# Patient Record
Sex: Male | Born: 1985 | Race: Black or African American | Hispanic: No | Marital: Single | Smoking: Former smoker
Health system: Southern US, Community
[De-identification: ages and names within clinical notes are randomized; demographics above are authoritative.]

---
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
## 2004-01-27 ENCOUNTER — Emergency Department (HOSPITAL_COMMUNITY): Admission: EM | Admit: 2004-01-27 | Discharge: 2004-01-29 | Payer: Self-pay | Admitting: Emergency Medicine

## 2005-11-22 ENCOUNTER — Emergency Department (HOSPITAL_COMMUNITY): Admission: EM | Admit: 2005-11-22 | Discharge: 2005-11-22 | Payer: Self-pay | Admitting: Emergency Medicine

## 2006-11-23 ENCOUNTER — Emergency Department (HOSPITAL_COMMUNITY): Admission: EM | Admit: 2006-11-23 | Discharge: 2006-11-23 | Payer: Self-pay | Admitting: Emergency Medicine

## 2007-02-02 ENCOUNTER — Emergency Department (HOSPITAL_COMMUNITY): Admission: EM | Admit: 2007-02-02 | Discharge: 2007-02-02 | Payer: Self-pay | Admitting: Emergency Medicine

## 2008-05-12 ENCOUNTER — Emergency Department (HOSPITAL_COMMUNITY): Admission: EM | Admit: 2008-05-12 | Discharge: 2008-05-13 | Payer: Self-pay | Admitting: Emergency Medicine

## 2008-07-28 ENCOUNTER — Emergency Department (HOSPITAL_COMMUNITY): Admission: EM | Admit: 2008-07-28 | Discharge: 2008-07-28 | Payer: Self-pay | Admitting: Emergency Medicine

## 2010-08-15 ENCOUNTER — Emergency Department (HOSPITAL_COMMUNITY)
Admission: EM | Admit: 2010-08-15 | Discharge: 2010-08-15 | Disposition: A | Discharge status: Home / Self Care | Payer: No Typology Code available for payment source | Attending: Emergency Medicine | Admitting: Emergency Medicine

## 2010-08-15 DIAGNOSIS — R05 Cough: Secondary | ICD-10-CM | POA: Insufficient documentation

## 2010-08-15 DIAGNOSIS — J069 Acute upper respiratory infection, unspecified: Secondary | ICD-10-CM | POA: Insufficient documentation

## 2010-08-15 DIAGNOSIS — R059 Cough, unspecified: Secondary | ICD-10-CM | POA: Insufficient documentation

## 2010-08-15 DIAGNOSIS — M25569 Pain in unspecified knee: Secondary | ICD-10-CM | POA: Insufficient documentation

## 2010-08-15 DIAGNOSIS — R079 Chest pain, unspecified: Secondary | ICD-10-CM | POA: Insufficient documentation

## 2010-08-15 DIAGNOSIS — J3489 Other specified disorders of nose and nasal sinuses: Secondary | ICD-10-CM | POA: Insufficient documentation

## 2010-08-15 DIAGNOSIS — R070 Pain in throat: Secondary | ICD-10-CM | POA: Insufficient documentation

## 2010-11-15 ENCOUNTER — Emergency Department (HOSPITAL_COMMUNITY)
Admission: EM | Admit: 2010-11-15 | Discharge: 2010-11-15 | Disposition: A | Discharge status: Home / Self Care | Payer: Self-pay | Attending: Emergency Medicine | Admitting: Emergency Medicine

## 2010-11-15 DIAGNOSIS — W010XXA Fall on same level from slipping, tripping and stumbling without subsequent striking against object, initial encounter: Secondary | ICD-10-CM | POA: Insufficient documentation

## 2010-11-15 DIAGNOSIS — Y9302 Activity, running: Secondary | ICD-10-CM | POA: Insufficient documentation

## 2010-11-15 DIAGNOSIS — S0003XA Contusion of scalp, initial encounter: Secondary | ICD-10-CM | POA: Insufficient documentation

## 2011-02-10 ENCOUNTER — Emergency Department (HOSPITAL_COMMUNITY)
Admission: EM | Admit: 2011-02-10 | Discharge: 2011-02-10 | Disposition: A | Discharge status: Home / Self Care | Payer: Self-pay | Attending: Emergency Medicine | Admitting: Emergency Medicine

## 2011-02-10 DIAGNOSIS — R229 Localized swelling, mass and lump, unspecified: Secondary | ICD-10-CM | POA: Insufficient documentation

## 2011-02-10 DIAGNOSIS — T6391XA Toxic effect of contact with unspecified venomous animal, accidental (unintentional), initial encounter: Secondary | ICD-10-CM | POA: Insufficient documentation

## 2011-02-10 DIAGNOSIS — L259 Unspecified contact dermatitis, unspecified cause: Secondary | ICD-10-CM | POA: Insufficient documentation

## 2011-02-10 DIAGNOSIS — L298 Other pruritus: Secondary | ICD-10-CM | POA: Insufficient documentation

## 2011-02-10 DIAGNOSIS — L2989 Other pruritus: Secondary | ICD-10-CM | POA: Insufficient documentation

## 2011-02-10 DIAGNOSIS — T63461A Toxic effect of venom of wasps, accidental (unintentional), initial encounter: Secondary | ICD-10-CM | POA: Insufficient documentation

## 2011-03-23 LAB — I-STAT 8, (EC8 V) (CONVERTED LAB)
Acid-base deficit: 1
BUN: 12
Bicarbonate: 24.6 — ABNORMAL HIGH
Chloride: 106
Glucose, Bld: 94
HCT: 47
Hemoglobin: 16
Operator id: 151321
Potassium: 3.8
Sodium: 140
TCO2: 26
pCO2, Ven: 43.4 — ABNORMAL LOW
pH, Ven: 7.361 — ABNORMAL HIGH

## 2011-03-23 LAB — POCT I-STAT CREATININE
Creatinine, Ser: 1.1
Operator id: 151321

## 2011-03-23 LAB — URINALYSIS, ROUTINE W REFLEX MICROSCOPIC
Bilirubin Urine: NEGATIVE
Glucose, UA: NEGATIVE
Hgb urine dipstick: NEGATIVE
Ketones, ur: 15 — AB
Nitrite: NEGATIVE
Protein, ur: NEGATIVE
Specific Gravity, Urine: 1.031 — ABNORMAL HIGH
Urobilinogen, UA: 1
pH: 7.5

## 2011-03-29 LAB — GC/CHLAMYDIA PROBE AMP, GENITAL
Chlamydia, DNA Probe: NEGATIVE
GC Probe Amp, Genital: NEGATIVE

## 2012-08-30 ENCOUNTER — Inpatient Hospital Stay (HOSPITAL_COMMUNITY): Payer: Self-pay

## 2012-08-30 ENCOUNTER — Emergency Department (HOSPITAL_COMMUNITY): Payer: Self-pay

## 2012-08-30 ENCOUNTER — Encounter (HOSPITAL_COMMUNITY): Payer: Self-pay | Admitting: Family Medicine

## 2012-08-30 ENCOUNTER — Inpatient Hospital Stay (HOSPITAL_COMMUNITY)
Admission: EM | Admit: 2012-08-30 | Discharge: 2012-09-03 | DRG: 131 | Payer: MEDICAID | Attending: General Surgery | Admitting: General Surgery

## 2012-08-30 ENCOUNTER — Encounter (HOSPITAL_COMMUNITY): Payer: Self-pay | Admitting: Anesthesiology

## 2012-08-30 ENCOUNTER — Encounter (HOSPITAL_COMMUNITY): Admission: EM | Payer: Self-pay | Source: Home / Self Care

## 2012-08-30 ENCOUNTER — Inpatient Hospital Stay (HOSPITAL_COMMUNITY): Payer: Self-pay | Admitting: Anesthesiology

## 2012-08-30 DIAGNOSIS — S72009A Fracture of unspecified part of neck of unspecified femur, initial encounter for closed fracture: Secondary | ICD-10-CM | POA: Diagnosis present

## 2012-08-30 DIAGNOSIS — S2241XA Multiple fractures of ribs, right side, initial encounter for closed fracture: Secondary | ICD-10-CM

## 2012-08-30 DIAGNOSIS — S2249XA Multiple fractures of ribs, unspecified side, initial encounter for closed fracture: Secondary | ICD-10-CM | POA: Diagnosis present

## 2012-08-30 DIAGNOSIS — S1093XA Contusion of unspecified part of neck, initial encounter: Secondary | ICD-10-CM | POA: Diagnosis present

## 2012-08-30 DIAGNOSIS — S02609A Fracture of mandible, unspecified, initial encounter for closed fracture: Secondary | ICD-10-CM

## 2012-08-30 DIAGNOSIS — M549 Dorsalgia, unspecified: Secondary | ICD-10-CM

## 2012-08-30 DIAGNOSIS — F17200 Nicotine dependence, unspecified, uncomplicated: Secondary | ICD-10-CM | POA: Diagnosis present

## 2012-08-30 DIAGNOSIS — D62 Acute posthemorrhagic anemia: Secondary | ICD-10-CM | POA: Diagnosis not present

## 2012-08-30 DIAGNOSIS — Y92410 Unspecified street and highway as the place of occurrence of the external cause: Secondary | ICD-10-CM

## 2012-08-30 DIAGNOSIS — S7291XA Unspecified fracture of right femur, initial encounter for closed fracture: Secondary | ICD-10-CM

## 2012-08-30 DIAGNOSIS — Y998 Other external cause status: Secondary | ICD-10-CM

## 2012-08-30 DIAGNOSIS — S0266XA Fracture of symphysis of mandible, initial encounter for closed fracture: Principal | ICD-10-CM | POA: Diagnosis present

## 2012-08-30 DIAGNOSIS — S0093XA Contusion of unspecified part of head, initial encounter: Secondary | ICD-10-CM

## 2012-08-30 DIAGNOSIS — J90 Pleural effusion, not elsewhere classified: Secondary | ICD-10-CM

## 2012-08-30 DIAGNOSIS — S2239XA Fracture of one rib, unspecified side, initial encounter for closed fracture: Secondary | ICD-10-CM

## 2012-08-30 DIAGNOSIS — S0003XA Contusion of scalp, initial encounter: Secondary | ICD-10-CM | POA: Diagnosis present

## 2012-08-30 DIAGNOSIS — S72001A Fracture of unspecified part of neck of right femur, initial encounter for closed fracture: Secondary | ICD-10-CM

## 2012-08-30 DIAGNOSIS — Z23 Encounter for immunization: Secondary | ICD-10-CM

## 2012-08-30 HISTORY — PX: ORIF MANDIBULAR FRACTURE: SHX2127

## 2012-08-30 HISTORY — PX: HIP PINNING,CANNULATED: SHX1758

## 2012-08-30 LAB — SAMPLE TO BLOOD BANK

## 2012-08-30 LAB — URINE MICROSCOPIC-ADD ON

## 2012-08-30 LAB — COMPREHENSIVE METABOLIC PANEL
ALT: 50 U/L (ref 0–53)
AST: 50 U/L — ABNORMAL HIGH (ref 0–37)
Albumin: 4 g/dL (ref 3.5–5.2)
Alkaline Phosphatase: 81 U/L (ref 39–117)
BUN: 18 mg/dL (ref 6–23)
CO2: 21 mEq/L (ref 19–32)
Calcium: 9.1 mg/dL (ref 8.4–10.5)
Chloride: 106 mEq/L (ref 96–112)
Creatinine, Ser: 1.1 mg/dL (ref 0.50–1.35)
GFR calc Af Amer: >90 mL/min (ref >90)
GFR calc non Af Amer: >90 mL/min (ref >90)
Glucose, Bld: 123 mg/dL — ABNORMAL HIGH (ref 70–99)
Potassium: 3.4 mEq/L — ABNORMAL LOW (ref 3.5–5.1)
Sodium: 142 mEq/L (ref 135–145)
Total Bilirubin: 0.6 mg/dL (ref 0.3–1.2)
Total Protein: 7.2 g/dL (ref 6.0–8.3)

## 2012-08-30 LAB — CBC
HCT: 46.4 % (ref 39.0–52.0)
Hemoglobin: 16.4 g/dL (ref 13.0–17.0)
MCH: 31.7 pg (ref 26.0–34.0)
MCHC: 35.3 g/dL (ref 30.0–36.0)
MCV: 89.7 fL (ref 78.0–100.0)
Platelets: 201 10*3/uL (ref 150–400)
RBC: 5.17 MIL/uL (ref 4.22–5.81)
RDW: 13.6 % (ref 11.5–15.5)
WBC: 17.9 10*3/uL — ABNORMAL HIGH (ref 4.0–10.5)

## 2012-08-30 LAB — APTT: aPTT: 28 seconds (ref 24–37)

## 2012-08-30 LAB — POCT I-STAT, CHEM 8
BUN: 19 mg/dL (ref 6–23)
Calcium, Ion: 1.07 mmol/L — ABNORMAL LOW (ref 1.12–1.23)
Chloride: 109 meq/L (ref 96–112)
Creatinine, Ser: 1.1 mg/dL (ref 0.50–1.35)
Glucose, Bld: 123 mg/dL — ABNORMAL HIGH (ref 70–99)
HCT: 50 % (ref 39.0–52.0)
Hemoglobin: 17 g/dL (ref 13.0–17.0)
Potassium: 3.4 mEq/L — ABNORMAL LOW (ref 3.5–5.1)
Sodium: 141 meq/L (ref 135–145)
TCO2: 22 mmol/L (ref 0–100)

## 2012-08-30 LAB — RAPID URINE DRUG SCREEN, HOSP PERFORMED
Amphetamines: NOT DETECTED
Barbiturates: NOT DETECTED
Benzodiazepines: NOT DETECTED
Cocaine: NOT DETECTED
Opiates: NOT DETECTED
Tetrahydrocannabinol: POSITIVE — AB

## 2012-08-30 LAB — ETHANOL: Alcohol, Ethyl (B): <11 mg/dL (ref 0–11)

## 2012-08-30 LAB — PROTIME-INR
INR: 1.01 (ref 0.00–1.49)
Prothrombin Time: 13.2 seconds (ref 11.6–15.2)

## 2012-08-30 LAB — URINALYSIS, ROUTINE W REFLEX MICROSCOPIC
Bilirubin Urine: NEGATIVE
Glucose, UA: NEGATIVE mg/dL
Ketones, ur: NEGATIVE mg/dL
Leukocytes, UA: NEGATIVE
Nitrite: NEGATIVE
Protein, ur: 30 mg/dL — AB
Specific Gravity, Urine: 1.02 (ref 1.005–1.030)
Urobilinogen, UA: 0.2 mg/dL (ref 0.0–1.0)
pH: 6 (ref 5.0–8.0)

## 2012-08-30 SURGERY — FIXATION, FEMUR, NECK, PERCUTANEOUS, USING SCREW
Anesthesia: General | Site: Mouth | Laterality: Right | Wound class: Clean

## 2012-08-30 MED ORDER — METOCLOPRAMIDE HCL 5 MG/ML IJ SOLN
INTRAMUSCULAR | Status: DC | PRN
  Administered 2012-08-30: 10 mg via INTRAVENOUS

## 2012-08-30 MED ORDER — ARTIFICIAL TEARS OP OINT
TOPICAL_OINTMENT | OPHTHALMIC | Status: DC | PRN
  Administered 2012-08-30: 1 via OPHTHALMIC

## 2012-08-30 MED ORDER — 0.9 % SODIUM CHLORIDE (POUR BTL) OPTIME
TOPICAL | Status: DC | PRN
  Administered 2012-08-30: 1000 mL

## 2012-08-30 MED ORDER — PROPOFOL 10 MG/ML IV BOLUS
INTRAVENOUS | Status: DC | PRN
  Administered 2012-08-30: 200 mg via INTRAVENOUS

## 2012-08-30 MED ORDER — FENTANYL CITRATE 0.05 MG/ML IJ SOLN
50.0000 ug | INTRAMUSCULAR | Status: DC | PRN
  Administered 2012-08-30 – 2012-09-01 (×2): 50 ug via INTRAVENOUS
  Filled 2012-08-30 (×2): qty 2

## 2012-08-30 MED ORDER — DEXTROSE 5 % IV SOLN
INTRAVENOUS | Status: DC | PRN
  Administered 2012-08-30: 21:00:00 via INTRAVENOUS

## 2012-08-30 MED ORDER — SUCCINYLCHOLINE CHLORIDE 20 MG/ML IJ SOLN
INTRAMUSCULAR | Status: DC | PRN
  Administered 2012-08-30: 140 mg via INTRAVENOUS

## 2012-08-30 MED ORDER — FENTANYL CITRATE 0.05 MG/ML IJ SOLN
INTRAMUSCULAR | Status: DC | PRN
  Administered 2012-08-30: 50 ug via INTRAVENOUS
  Administered 2012-08-30: 200 ug via INTRAVENOUS
  Administered 2012-08-30 (×2): 50 ug via INTRAVENOUS
  Administered 2012-08-30: 150 ug via INTRAVENOUS
  Administered 2012-08-30: 50 ug via INTRAVENOUS
  Administered 2012-08-31: 100 ug via INTRAVENOUS
  Administered 2012-08-31 (×2): 50 ug via INTRAVENOUS

## 2012-08-30 MED ORDER — LIDOCAINE-EPINEPHRINE 2 %-1:100000 IJ SOLN
INTRAMUSCULAR | Status: AC
  Filled 2012-08-30: qty 3.4

## 2012-08-30 MED ORDER — IOHEXOL 300 MG/ML  SOLN
100.0000 mL | Freq: Once | INTRAMUSCULAR | Status: AC | PRN
  Administered 2012-08-30: 100 mL via INTRAVENOUS

## 2012-08-30 MED ORDER — MIDAZOLAM HCL 5 MG/5ML IJ SOLN
INTRAMUSCULAR | Status: DC | PRN
  Administered 2012-08-30: 2 mg via INTRAVENOUS

## 2012-08-30 MED ORDER — ONDANSETRON HCL 4 MG/2ML IJ SOLN
INTRAMUSCULAR | Status: DC | PRN
  Administered 2012-08-30 – 2012-08-31 (×2): 4 mg via INTRAVENOUS

## 2012-08-30 MED ORDER — FENTANYL CITRATE 0.05 MG/ML IJ SOLN
50.0000 ug | Freq: Once | INTRAMUSCULAR | Status: AC
  Administered 2012-08-30: 50 ug via INTRAVENOUS
  Filled 2012-08-30: qty 2

## 2012-08-30 MED ORDER — DEXAMETHASONE SODIUM PHOSPHATE 4 MG/ML IJ SOLN
INTRAMUSCULAR | Status: DC | PRN
  Administered 2012-08-30: 12 mg via INTRAVENOUS

## 2012-08-30 MED ORDER — LACTATED RINGERS IV SOLN
INTRAVENOUS | Status: DC | PRN
  Administered 2012-08-30 – 2012-08-31 (×5): via INTRAVENOUS

## 2012-08-30 MED ORDER — VECURONIUM BROMIDE 10 MG IV SOLR
INTRAVENOUS | Status: DC | PRN
  Administered 2012-08-30: 10 mg via INTRAVENOUS
  Administered 2012-08-30: 5 mg via INTRAVENOUS

## 2012-08-30 MED ORDER — CEFAZOLIN SODIUM-DEXTROSE 2-3 GM-% IV SOLR
2.0000 g | Freq: Once | INTRAVENOUS | Status: AC
  Administered 2012-08-30: 2 g via INTRAVENOUS
  Administered 2012-08-31: 1 g via INTRAVENOUS

## 2012-08-30 MED ORDER — LIDOCAINE-EPINEPHRINE 2 %-1:100000 IJ SOLN
INTRAMUSCULAR | Status: AC
  Filled 2012-08-30: qty 1

## 2012-08-30 MED ORDER — LIDOCAINE HCL (CARDIAC) 20 MG/ML IV SOLN
INTRAVENOUS | Status: DC | PRN
  Administered 2012-08-30: 60 mg via INTRAVENOUS

## 2012-08-30 SURGICAL SUPPLY — 101 items
1.6 DRILL BIT ×1 IMPLANT
ADH SKN CLS APL DERMABOND .7 (GAUZE/BANDAGES/DRESSINGS) ×4
ATTRACTOMAT 16X20 MAGNETIC DRP (DRAPES) ×2 IMPLANT
BAND DENTAL 1/4IN PULL MED (MISCELLANEOUS) IMPLANT
BAND DENTAL 3/16IN PULL HEAVY (MISCELLANEOUS) ×1 IMPLANT
BIT DRILL 5 ACE CANN QC (BIT) ×1 IMPLANT
BIT DRILL TWIST 58MM 26MM WL (DRILL) IMPLANT
BLADE SURG 15 STRL LF DISP TIS (BLADE) ×4 IMPLANT
BLADE SURG 15 STRL SS (BLADE) ×3
BUR CROSS CUT (BURR)
BUR CROSS CUT FISSURE 1.6 (BURR) IMPLANT
BUR RND FLUTED 2.5 (BURR) IMPLANT
BUR SRG MED 1.2XXCUT FSSR (BURR) IMPLANT
BUR SRG MED 1.6XXCUT FSSR (BURR) IMPLANT
BUR SURG 4X8 MED (BURR) IMPLANT
BURR SRG MED 1.2XXCUT FSSR (BURR)
BURR SRG MED 1.6XXCUT FSSR (BURR)
BURR SURG 4X8 MED (BURR)
CANISTER SUCTION 2500CC (MISCELLANEOUS) ×3 IMPLANT
CLEANER TIP ELECTROSURG 2X2 (MISCELLANEOUS) IMPLANT
CLOTH BEACON ORANGE TIMEOUT ST (SAFETY) ×6 IMPLANT
COVER SURGICAL LIGHT HANDLE (MISCELLANEOUS) ×5 IMPLANT
DERMABOND ADVANCED (GAUZE/BANDAGES/DRESSINGS) ×2
DERMABOND ADVANCED .7 DNX12 (GAUZE/BANDAGES/DRESSINGS) IMPLANT
DRAPE STERI IOBAN 125X83 (DRAPES) ×2 IMPLANT
DRAPE SURG 17X23 STRL (DRAPES) ×4 IMPLANT
DRESSING TELFA 8X3 (GAUZE/BANDAGES/DRESSINGS) IMPLANT
DRILL TWIST 58MM 26MM WL (DRILL) ×3
DRSG ADAPTIC 3X8 NADH LF (GAUZE/BANDAGES/DRESSINGS) ×3 IMPLANT
DRSG MEPILEX BORDER 4X4 (GAUZE/BANDAGES/DRESSINGS) ×4 IMPLANT
DRSG MEPILEX BORDER 4X8 (GAUZE/BANDAGES/DRESSINGS) ×1 IMPLANT
DURAPREP 26ML APPLICATOR (WOUND CARE) ×3 IMPLANT
ELECT COATED BLADE 2.86 ST (ELECTRODE) ×2 IMPLANT
ELECT REM PT RETURN 9FT ADLT (ELECTROSURGICAL) ×6
ELECTRODE REM PT RTRN 9FT ADLT (ELECTROSURGICAL) ×2 IMPLANT
FACESHIELD LNG OPTICON STERILE (SAFETY) ×2 IMPLANT
GAUZE PACKING FOLDED 2  STR (GAUZE/BANDAGES/DRESSINGS)
GAUZE PACKING FOLDED 2 STR (GAUZE/BANDAGES/DRESSINGS) ×2 IMPLANT
GLOVE BIO SURGEON STRL SZ7.5 (GLOVE) ×3 IMPLANT
GLOVE BIO SURGEON STRL SZ8 (GLOVE) ×2 IMPLANT
GLOVE BIO SURGEON STRL SZ8.5 (GLOVE) ×1 IMPLANT
GLOVE BIOGEL PI IND STRL 7.0 (GLOVE) IMPLANT
GLOVE BIOGEL PI IND STRL 7.5 (GLOVE) ×4 IMPLANT
GLOVE BIOGEL PI IND STRL 8 (GLOVE) ×2 IMPLANT
GLOVE BIOGEL PI INDICATOR 7.0 (GLOVE) ×1
GLOVE BIOGEL PI INDICATOR 7.5 (GLOVE) ×3
GLOVE BIOGEL PI INDICATOR 8 (GLOVE) ×1
GLOVE ORTHO TXT STRL SZ7.5 (GLOVE) ×5 IMPLANT
GLOVE SURG ORTHO 8.0 STRL STRW (GLOVE) ×2 IMPLANT
GOWN STRL NON-REIN LRG LVL3 (GOWN DISPOSABLE) ×13 IMPLANT
GOWN STRL REIN 3XL LVL4 (GOWN DISPOSABLE) ×2 IMPLANT
KIT BASIN OR (CUSTOM PROCEDURE TRAY) ×6 IMPLANT
KIT ROOM TURNOVER OR (KITS) ×5 IMPLANT
MANIFOLD NEPTUNE II (INSTRUMENTS) ×3 IMPLANT
NDL BLUNT 16X1.5 OR ONLY (NEEDLE) ×2 IMPLANT
NDL DENTAL 27 LONG (NEEDLE) ×4 IMPLANT
NEEDLE 27GAX1X1/2 (NEEDLE) ×1 IMPLANT
NEEDLE BLUNT 16X1.5 OR ONLY (NEEDLE) IMPLANT
NEEDLE DENTAL 27 LONG (NEEDLE) IMPLANT
NS IRRIG 1000ML POUR BTL (IV SOLUTION) ×6 IMPLANT
PACK GENERAL/GYN (CUSTOM PROCEDURE TRAY) ×3 IMPLANT
PAD ARMBOARD 7.5X6 YLW CONV (MISCELLANEOUS) ×11 IMPLANT
PENCIL BUTTON HOLSTER BLD 10FT (ELECTRODE) ×1 IMPLANT
PIN THREADED GUIDE ACE (PIN) ×3 IMPLANT
PLATE MNDBLE MINI 6H (Plate) ×1 IMPLANT
SCISSORS WIRE ANG 4 3/4 DISP (INSTRUMENTS) ×1 IMPLANT
SCREW CANN 8.0 85MM (Screw) ×1 IMPLANT
SCREW CANN 8.0 90MM (Screw) ×1 IMPLANT
SCREW CANNULATED 8.0MMX100 (Screw) ×1 IMPLANT
SCREW LOCKING 2.3X12MM (Screw) ×2 IMPLANT
SCREW LOCKING 2.3X14MM (Screw) ×2 IMPLANT
SCREW MNDBLE 2.0X6 BONE (Screw) ×4 IMPLANT
SCREW MNDBLE 2.0X8 BONE (Screw) ×2 IMPLANT
SPONGE GAUZE 4X4 12PLY (GAUZE/BANDAGES/DRESSINGS) IMPLANT
STAPLER VISISTAT 35W (STAPLE) ×3 IMPLANT
SUT CHROMIC 3 0 PS 2 (SUTURE) IMPLANT
SUT MNCRL AB 4-0 PS2 18 (SUTURE) ×1 IMPLANT
SUT PROLENE 5 0 RB 1 DA (SUTURE) ×1 IMPLANT
SUT STEEL 0 (SUTURE)
SUT STEEL 0 18XMFL TIE 17 (SUTURE) ×2 IMPLANT
SUT STEEL 2 (SUTURE) ×2 IMPLANT
SUT VIC AB 0 CT1 27 (SUTURE) ×3
SUT VIC AB 0 CT1 27XBRD ANBCTR (SUTURE) ×2 IMPLANT
SUT VIC AB 1 CT1 27 (SUTURE) ×3
SUT VIC AB 1 CT1 27XBRD ANBCTR (SUTURE) IMPLANT
SUT VIC AB 2-0 CT1 27 (SUTURE) ×6
SUT VIC AB 2-0 CT1 TAPERPNT 27 (SUTURE) ×2 IMPLANT
SUT VIC AB 4-0 PS2 27 (SUTURE) ×2 IMPLANT
SYR 50ML SLIP (SYRINGE) ×2 IMPLANT
SYR BULB 3OZ (MISCELLANEOUS) ×1 IMPLANT
SYR CONTROL 10ML LL (SYRINGE) ×1 IMPLANT
TEMPLATE PLATE MANDIBUL 6HOLE (Plate) ×1 IMPLANT
TEMPLATE PLATE MANDIBUL NS 6H (Plate) ×1 IMPLANT
TOOTHBRUSH ADULT (PERSONAL CARE ITEMS) ×2 IMPLANT
TOWEL OR 17X24 6PK STRL BLUE (TOWEL DISPOSABLE) ×6 IMPLANT
TOWEL OR 17X26 10 PK STRL BLUE (TOWEL DISPOSABLE) ×5 IMPLANT
TRAY ENT MC OR (CUSTOM PROCEDURE TRAY) ×3 IMPLANT
TUBE CONNECTING 12X1/4 (SUCTIONS) ×1 IMPLANT
TUBING IRRIGATION (MISCELLANEOUS) IMPLANT
WATER STERILE IRR 1000ML POUR (IV SOLUTION) ×4 IMPLANT
YANKAUER SUCT BULB TIP NO VENT (SUCTIONS) ×2 IMPLANT
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
## 2012-08-30 NOTE — Preoperative (Signed)
Beta Blockers   Reason not to administer Beta Blockers:Not Applicable 

## 2012-08-30 NOTE — Brief Op Note (Signed)
08/30/2012  11:31 PM  PATIENT:  Drew Medina  27 y.o. male  PRE-OPERATIVE DIAGNOSIS:  right fracture femoral neck  POST-OPERATIVE DIAGNOSIS:  Closed, displaced right fracture femoral neck  PROCEDURE:  Procedure(s): Open reduction assistance internal fixation with percutaneously placed CANNULATED screws of the right hip (BIOMET)   SURGEON:  Surgeon(s) and Role: Panel 1:    * Shelda Pal, MD - Primary  PHYSICIAN ASSISTANT: None  ASSISTANTS: surgical team  ANESTHESIA:   general  EBL:  Total I/O In: 2100 [I.V.:2100] Out: 300 [Urine:300]  BLOOD ADMINISTERED:none  DRAINS: none   LOCAL MEDICATIONS USED:  NONE  SPECIMEN:  No Specimen  DISPOSITION OF SPECIMEN:  N/A  COUNTS:  YES  TOURNIQUET:  * No tourniquets in log *  DICTATION: .Other Dictation: Dictation Number [id number]  PLAN OF CARE: Admit to inpatient   PATIENT DISPOSITION:  PACU - hemodynamically stable.   Delay start of Pharmacological VTE agent (>24hrs) due to surgical blood loss or risk of bleeding: no

## 2012-08-30 NOTE — OR Nursing (Signed)
End of Procedure #1 @ 1049pm

## 2012-08-30 NOTE — ED Notes (Signed)
Hematoma noted to periorbital area. Blood in mouth, airway intact pt AAOx4.
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
## 2012-08-30 NOTE — Progress Notes (Signed)
When I arrived, [employer] staff members were w/pt. I explained to physician that family was in the waiting room. I learned from [employer] that pt was in their custody. I notified admission staff and they placed pt on XXX. At that point, I did not meet w/family. Marjory Lies Chaplain

## 2012-08-30 NOTE — ED Notes (Signed)
Requested urine from patient. Patient unable to provide urine after attempting to urinate into urinal.
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
## 2012-08-30 NOTE — Anesthesia Procedure Notes (Signed)
Procedure Name: Intubation Date/Time: 08/30/2012 9:11 PM Performed by: Wray Kearns A Pre-anesthesia Checklist: Patient identified, Timeout performed, Emergency Drugs available, Suction available and Patient being monitored Patient Re-evaluated:Patient Re-evaluated prior to inductionOxygen Delivery Method: Circle system utilized Preoxygenation: Pre-oxygenation with 100% oxygen Intubation Type: IV induction, Rapid sequence and Cricoid Pressure applied Laryngoscope Size: Mac and 4 Grade View: Grade I Nasal Tubes: Left, Nasal prep performed, Nasal Rae and Magill forceps- large, utilized Tube size: 7.5 mm Airway Equipment and Method: Bougie stylet Placement Confirmation: ETT inserted through vocal cords under direct vision,  breath sounds checked- equal and bilateral and positive ETCO2 Secured at: 29 cm Tube secured with: Tape Dental Injury: Teeth and Oropharynx as per pre-operative assessment  Comments: Left naris dilated with lubricated nasal airways( #6.5, #7.0, #7.5, #8.0 ) prior to Laryngoscopy . SaO2 remained 100 % throughout induction.

## 2012-08-30 NOTE — ED Notes (Signed)
Pt transported to radiology.

## 2012-08-30 NOTE — OR Nursing (Signed)
Dr. Jeanice Lim start time: 2340

## 2012-08-30 NOTE — ED Notes (Signed)
Oral surgery at bedside

## 2012-08-31 LAB — BASIC METABOLIC PANEL
CO2: 25 mEq/L (ref 19–32)
Calcium: 8.2 mg/dL — ABNORMAL LOW (ref 8.4–10.5)
Creatinine, Ser: 1.02 mg/dL (ref 0.50–1.35)

## 2012-08-31 LAB — MRSA PCR SCREENING: MRSA by PCR: NEGATIVE

## 2012-08-31 LAB — CBC
MCH: 31.1 pg (ref 26.0–34.0)
MCV: 90.4 fL (ref 78.0–100.0)
Platelets: 207 10*3/uL (ref 150–400)
RBC: 4.37 MIL/uL (ref 4.22–5.81)
RDW: 13.9 % (ref 11.5–15.5)

## 2012-08-31 MED ORDER — ONDANSETRON HCL 4 MG PO TABS: 4.0000 mg | ORAL_TABLET | Freq: Four times a day (QID) | ORAL | Status: DC | PRN

## 2012-08-31 MED ORDER — CEFAZOLIN SODIUM 1-5 GM-% IV SOLN
1.0000 g | Freq: Four times a day (QID) | INTRAVENOUS | Status: DC
  Administered 2012-08-31 – 2012-09-03 (×13): 1 g via INTRAVENOUS
  Filled 2012-08-31 (×17): qty 50

## 2012-08-31 MED ORDER — HYDROMORPHONE HCL PF 1 MG/ML IJ SOLN
0.2500 mg | INTRAMUSCULAR | Status: DC | PRN
  Administered 2012-08-31 (×2): 0.5 mg via INTRAVENOUS

## 2012-08-31 MED ORDER — NEOSTIGMINE METHYLSULFATE 1 MG/ML IJ SOLN
INTRAMUSCULAR | Status: DC | PRN
  Administered 2012-08-31: 5 mg via INTRAVENOUS

## 2012-08-31 MED ORDER — ONDANSETRON HCL 4 MG/2ML IJ SOLN: 4.0000 mg | Freq: Four times a day (QID) | INTRAMUSCULAR | Status: DC | PRN

## 2012-08-31 MED ORDER — INFLUENZA VIRUS VACC SPLIT PF IM SUSP
0.5000 mL | INTRAMUSCULAR | Status: AC
  Administered 2012-09-01: 0.5 mL via INTRAMUSCULAR
  Filled 2012-08-31 (×2): qty 0.5

## 2012-08-31 MED ORDER — PANTOPRAZOLE SODIUM 40 MG IV SOLR
40.0000 mg | Freq: Every day | INTRAVENOUS | Status: DC
  Administered 2012-08-31 – 2012-09-01 (×2): 40 mg via INTRAVENOUS
  Filled 2012-08-31 (×3): qty 40

## 2012-08-31 MED ORDER — BACITRACIN ZINC 500 UNIT/GM EX OINT
TOPICAL_OINTMENT | CUTANEOUS | Status: AC
  Filled 2012-08-31: qty 15

## 2012-08-31 MED ORDER — HYDROMORPHONE HCL PF 1 MG/ML IJ SOLN
1.0000 mg | INTRAMUSCULAR | Status: DC | PRN
  Administered 2012-08-31 – 2012-09-01 (×2): 1 mg via INTRAVENOUS
  Filled 2012-08-31 (×2): qty 1

## 2012-08-31 MED ORDER — CEFAZOLIN SODIUM 1-5 GM-% IV SOLN
INTRAVENOUS | Status: AC
  Filled 2012-08-31: qty 50

## 2012-08-31 MED ORDER — MORPHINE SULFATE 2 MG/ML IJ SOLN
2.0000 mg | INTRAMUSCULAR | Status: DC | PRN
  Administered 2012-08-31 – 2012-09-01 (×11): 2 mg via INTRAVENOUS
  Filled 2012-08-31 (×11): qty 1

## 2012-08-31 MED ORDER — HYDROMORPHONE HCL PF 1 MG/ML IJ SOLN
INTRAMUSCULAR | Status: AC
  Administered 2012-08-31: 1 mg
  Filled 2012-08-31: qty 1

## 2012-08-31 MED ORDER — PNEUMOCOCCAL VAC POLYVALENT 25 MCG/0.5ML IJ INJ
0.5000 mL | INJECTION | INTRAMUSCULAR | Status: AC
  Administered 2012-09-01: 0.5 mL via INTRAMUSCULAR
  Filled 2012-08-31 (×2): qty 0.5

## 2012-08-31 MED ORDER — SODIUM CHLORIDE 0.9 % IV SOLN
INTRAVENOUS | Status: DC
  Administered 2012-08-31: 09:00:00 via INTRAVENOUS
  Administered 2012-09-01: 100 mL/h via INTRAVENOUS
  Administered 2012-09-02 – 2012-09-03 (×2): via INTRAVENOUS

## 2012-08-31 MED ORDER — ACETAMINOPHEN 10 MG/ML IV SOLN
1000.0000 mg | Freq: Once | INTRAVENOUS | Status: AC | PRN
  Administered 2012-08-30: 1000 mg via INTRAVENOUS

## 2012-08-31 MED ORDER — PANTOPRAZOLE SODIUM 40 MG PO TBEC: 40.0000 mg | DELAYED_RELEASE_TABLET | Freq: Every day | ORAL | Status: DC

## 2012-08-31 MED ORDER — LIDOCAINE-EPINEPHRINE 2 %-1:100000 IJ SOLN
INTRAMUSCULAR | Status: DC | PRN
  Administered 2012-08-31: 10 mL

## 2012-08-31 MED ORDER — ONDANSETRON HCL 4 MG/2ML IJ SOLN: 4.0000 mg | Freq: Once | INTRAMUSCULAR | Status: AC | PRN

## 2012-08-31 MED ORDER — BACITRACIN ZINC 500 UNIT/GM EX OINT
TOPICAL_OINTMENT | CUTANEOUS | Status: DC | PRN
  Administered 2012-08-31: 1 via TOPICAL

## 2012-08-31 MED ORDER — GLYCOPYRROLATE 0.2 MG/ML IJ SOLN
INTRAMUSCULAR | Status: DC | PRN
  Administered 2012-08-31: 0.6 mg via INTRAVENOUS

## 2012-08-31 MED ORDER — DEXTROSE 5 % IV SOLN
INTRAVENOUS | Status: DC | PRN
  Administered 2012-08-31: 01:00:00 via INTRAVENOUS
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
## 2012-08-31 NOTE — Evaluation (Signed)
Physical Therapy Evaluation Patient Details Name: Drew Medina MRN: [id number] DOB: 22-Oct-1985 Today's Date: 08/31/2012 Time: 7829-5621 PT Time Calculation (min): 22 min  PT Assessment / Plan / Recommendation Clinical Impression  Acute PT indicated to maximize functional mobility prior to d/c.  Pt facing incarceration following d/c from hospital.  Pt will need continued PT services reguardless of d/c environment.    PT Assessment  Patient needs continued PT services    Follow Up Recommendations  Home health PT;Supervision - Intermittent    Does the patient have the potential to tolerate intense rehabilitation      Barriers to Discharge None      Equipment Recommendations  Rolling walker with 5" wheels    Recommendations for Other Services     Frequency Min 6X/week    Precautions / Restrictions Restrictions RLE Weight Bearing: Touchdown weight bearing   Pertinent Vitals/Pain 8/10      Mobility  Bed Mobility Bed Mobility: Supine to Sit Supine to Sit: 4: Min assist Details for Bed Mobility Assistance: verbal cues for sequencing Transfers Transfers: Sit to Stand;Stand to Sit Sit to Stand: 4: Min assist;From bed;With upper extremity assist Stand to Sit: 4: Min assist;With armrests;To chair/3-in-1 Details for Transfer Assistance: verbal cues for hand placement, increased time to complete stand to sit Ambulation/Gait Ambulation/Gait Assistance: 4: Min assist Ambulation Distance (Feet): 60 Feet Assistive device: Rolling walker Ambulation/Gait Assistance Details: verbal cues for sequencing Gait Pattern: Step-to pattern;Antalgic Gait velocity: decreased General Gait Details: HR increased to 122 during gait requiring standing rest break.  HR to 106 after 0.5-1 minute rest.    Exercises     PT Diagnosis: Difficulty walking;Acute pain  PT Problem List: Decreased activity tolerance;Decreased mobility;Decreased knowledge of use of DME;Decreased knowledge of  precautions;Pain PT Treatment Interventions: DME instruction;Gait training;Stair training;Functional mobility training;Therapeutic activities;Therapeutic exercise;Balance training;Patient/family education   PT Goals Acute Rehab PT Goals PT Goal Formulation: With patient Time For Goal Achievement: 08/31/12 Potential to Achieve Goals: Good Pt will go Supine/Side to Sit: with modified independence PT Goal: Supine/Side to Sit - Progress: Goal set today Pt will go Sit to Supine/Side: with modified independence PT Goal: Sit to Supine/Side - Progress: Goal set today Pt will go Sit to Stand: with supervision PT Goal: Sit to Stand - Progress: Goal set today Pt will go Stand to Sit: with supervision PT Goal: Stand to Sit - Progress: Goal set today Pt will Ambulate: >150 feet;with supervision;with least restrictive assistive device PT Goal: Ambulate - Progress: Goal set today  Visit Information  Last PT Received On: 08/31/12 Assistance Needed: +1    Subjective Data  Subjective: no c/o Patient Stated Goal: home   Prior Functioning  Home Living Lives With: Alone Available Help at Discharge: Family;Available PRN/intermittently Type of Home: Apartment Home Access: Stairs to enter Entrance Stairs-Number of Steps: 3 flights Home Layout: One level Home Adaptive Equipment: None Additional Comments: Pt facing incarceration following d/c from hospital. Prior Function Level of Independence: Independent Able to Take Stairs?: Yes Driving: Yes Communication Communication: Other (comment) (mouth wired shut)    Cognition  Cognition Overall Cognitive Status: Appears within functional limits for tasks assessed/performed Arousal/Alertness: Awake/alert Orientation Level: Appears intact for tasks assessed Behavior During Session: [facility] for tasks performed    Extremity/Trunk Assessment     Balance    End of Session PT - End of Session Equipment Utilized During Treatment: Gait belt Activity  Tolerance: Patient tolerated treatment well Patient left: in chair;with call bell/phone within reach;with nursing in room  Nurse Communication: Mobility status  GP     Ilda Foil 08/31/2012, 2:33 PM  Aida Raider, PT  Office # [phone] Pager [phone]
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
## 2012-08-31 NOTE — Anesthesia Postprocedure Evaluation (Signed)
  Anesthesia Post-op Note  Patient: Drew Medina  Procedure(s) Performed: Procedure(s): Right CANNULATED HIP PINNING (Right) OPEN REDUCTION INTERNAL FIXATION (ORIF) MANDIBULAR FRACTURE (N/A)  Patient Location: PACU  Anesthesia Type:General  Level of Consciousness: awake, alert  and oriented  Airway and Oxygen Therapy: Patient Spontanous Breathing and Patient connected to nasal cannula oxygen  Post-op Pain: mild  Post-op Assessment: Post-op Vital signs reviewed, Patient's Cardiovascular Status Stable, Respiratory Function Stable, Patent Airway and Pain level controlled  Post-op Vital Signs: stable  Complications: No apparent anesthesia complications

## 2012-08-31 NOTE — Progress Notes (Signed)
Pt tx 5N per MD order, pt VSS,pt verbalized understanding of tx,  pt tol well, report called to receiving RN, all questions answered, wire scissors in chart, [employer] at [facility]

## 2012-08-31 NOTE — Anesthesia Preprocedure Evaluation (Signed)
Anesthesia Evaluation  Patient identified by MRN, date of birth, ID band Patient awake    Reviewed: Allergy & Precautions, H&P , NPO status , Patient's Chart, lab work & pertinent test results  Airway       Dental  (+) Teeth Intact and Dental Advisory Given   Pulmonary  breath sounds clear to auscultation        Cardiovascular Rhythm:Regular Rate:Normal     Neuro/Psych    GI/Hepatic   Endo/Other    Renal/GU      Musculoskeletal   Abdominal   Peds  Hematology   Anesthesia Other Findings   Reproductive/Obstetrics                           Anesthesia Physical Anesthesia Plan  ASA: III and emergent  Anesthesia Plan: General   Post-op Pain Management:    Induction: Intravenous  Airway Management Planned: Oral ETT  Additional Equipment:   Intra-op Plan:   Post-operative Plan: Extubation in OR  Informed Consent: I have reviewed the patients History and Physical, chart, labs and discussed the procedure including the risks, benefits and alternatives for the proposed anesthesia with the patient or authorized representative who has indicated his/her understanding and acceptance.   Dental advisory given  Plan Discussed with: CRNA and Surgeon  Anesthesia Plan Comments:         Anesthesia Quick Evaluation
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
## 2012-08-31 NOTE — Transfer of Care (Signed)
Immediate Anesthesia Transfer of Care Note  Patient: Drew Medina  Procedure(s) Performed: Procedure(s): Right CANNULATED HIP PINNING (Right) OPEN REDUCTION INTERNAL FIXATION (ORIF) MANDIBULAR FRACTURE (N/A)  Patient Location: PACU  Anesthesia Type:General  Level of Consciousness: oriented, sedated, patient cooperative and responds to stimulation  Airway & Oxygen Therapy: Patient Spontanous Breathing and Patient connected to nasal cannula oxygen  Post-op Assessment: Report given to PACU RN, Post -op Vital signs reviewed and stable, Patient moving all extremities and Patient moving all extremities X 4  Post vital signs: Reviewed and stable  Complications: No apparent anesthesia complications

## 2012-09-01 DIAGNOSIS — D62 Acute posthemorrhagic anemia: Secondary | ICD-10-CM

## 2012-09-01 LAB — URINE CULTURE: Colony Count: 3000

## 2012-09-01 LAB — CBC
MCHC: 33.8 g/dL (ref 30.0–36.0)
MCV: 91.1 fL (ref 78.0–100.0)
Platelets: 183 10*3/uL (ref 150–400)
RDW: 14 % (ref 11.5–15.5)
WBC: 11.2 10*3/uL — ABNORMAL HIGH (ref 4.0–10.5)

## 2012-09-01 MED ORDER — HYDROCODONE-ACETAMINOPHEN 7.5-325 MG/15ML PO SOLN
10.0000 mL | ORAL | Status: DC | PRN
  Administered 2012-09-01: 5 mL via ORAL
  Administered 2012-09-02: 15 mL via ORAL
  Administered 2012-09-02 (×2): 10 mL via ORAL
  Administered 2012-09-03 (×2): 15 mL via ORAL
  Filled 2012-09-01 (×6): qty 15

## 2012-09-01 MED ORDER — WHITE PETROLATUM GEL
Status: AC
  Filled 2012-09-01: qty 5

## 2012-09-01 MED ORDER — MORPHINE SULFATE 2 MG/ML IJ SOLN
2.0000 mg | INTRAMUSCULAR | Status: DC | PRN
  Administered 2012-09-01 – 2012-09-02 (×5): 2 mg via INTRAVENOUS
  Filled 2012-09-01 (×6): qty 1
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
## 2012-09-01 NOTE — Progress Notes (Signed)
Physical Therapy Treatment Patient Details Name: Drew Medina MRN: [id number] DOB: 05-Sep-1985 Today's Date: 09/01/2012 Time: 4696-2952 PT Time Calculation (min): 24 min  PT Assessment / Plan / Recommendation Comments on Treatment Session  Patient s/p R hip ORIF and manible ORIF with jaw wired. Patient with increased pain this session and unable to ambulate as well as he did on eval. Continue to progress with ambulation. Awaiting incarceration when discharged    Follow Up Recommendations  Home health PT;Supervision - Intermittent     Does the patient have the potential to tolerate intense rehabilitation     Barriers to Discharge        Equipment Recommendations  Rolling walker with 5" wheels    Recommendations for Other Services    Frequency Min 6X/week   Plan Discharge plan remains appropriate;Frequency remains appropriate    Precautions / Restrictions Restrictions RLE Weight Bearing: Touchdown weight bearing   Pertinent Vitals/Pain 10/10 R leg pain. RN made aware    Mobility  Bed Mobility Supine to Sit: 3: Mod assist Details for Bed Mobility Assistance: verbal cues for sequencing. A for trunk control into sitting.  Transfers Sit to Stand: 4: Min assist;From bed;With upper extremity assist Stand to Sit: 4: Min assist;With armrests;To chair/3-in-1 Details for Transfer Assistance: verbal cues for hand placement, increased time to complete stand to sit. Cues to not put weight through R LE Ambulation/Gait Ambulation/Gait Assistance: 4: Min assist Ambulation Distance (Feet): 30 Feet Assistive device: Rolling walker Ambulation/Gait Assistance Details: Cues for posture and to stand closer into RW Gait Pattern: Step-to pattern;Antalgic Gait velocity: decreased    Exercises     PT Diagnosis:    PT Problem List:   PT Treatment Interventions:     PT Goals Acute Rehab PT Goals PT Goal: Supine/Side to Sit - Progress: Progressing toward goal PT Goal: Sit to Stand -  Progress: Progressing toward goal PT Goal: Stand to Sit - Progress: Progressing toward goal PT Goal: Ambulate - Progress: Progressing toward goal  Visit Information  Last PT Received On: 09/01/12 Assistance Needed: +1    Subjective Data      Cognition  Cognition Overall Cognitive Status: Appears within functional limits for tasks assessed/performed Arousal/Alertness: Awake/alert Orientation Level: Appears intact for tasks assessed Behavior During Session: [facility] for tasks performed    Balance     End of Session PT - End of Session Equipment Utilized During Treatment: Gait belt Activity Tolerance: Patient limited by pain Patient left: in chair;with call bell/phone within reach;with nursing in room Nurse Communication: Mobility status   GP     Fredrich Birks 09/01/2012, 10:11 AM 09/01/2012 Fredrich Birks PTA [phone] pager [phone] office

## 2012-09-01 NOTE — Progress Notes (Signed)
Patient not very talkative.  Will continue to get therapy and hopefully be able to discharge to jail soon.  This patient has been seen and I agree with the findings and treatment plan.  Marta Lamas. Gae Bon, MD, FACS [phone] (pager) [phone] (direct pager) Trauma Surgeon

## 2012-09-01 NOTE — Progress Notes (Signed)
Patient ID: Drew Medina, male   DOB: Mar 17, 1986, 27 y.o.   MRN: [id number] Subjective: 2 Days Post-Op Procedure(s) (LRB): Right CANNULATED HIP PINNING (Right) OPEN REDUCTion assited closed reduction right femoral neck  Hungry    Patient reports pain as moderate.  Dealing with other injuries as well, but improved right hip pain with movement  Objective:   VITALS:   Filed Vitals:   09/01/12 0500  BP: 139/76  Pulse: 100  Temp: 97.5 F (36.4 C)  Resp: 16    Neurovascular intact Incision: dressing C/D/I, both right hip wounds  LABS  Recent Labs  08/30/12 1627 08/30/12 1650 08/31/12 0528  HGB 16.4 17.0 13.6  HCT 46.4 50.0 39.5  WBC 17.9*  --  13.5*  PLT 201  --  207     Recent Labs  08/30/12 1627 08/30/12 1650 08/31/12 0528  NA 142 141 141  K 3.4* 3.4* 4.1  BUN 18 19 15   CREATININE 1.10 1.10 1.02  GLUCOSE 123* 123* 158*     Recent Labs  08/30/12 1627  INR 1.01     Assessment/Plan: 2 Days Post-Op Procedure(s) (LRB) Right CANNULATED HIP PINNING (Right) OPEN REDUCTion assited closed reduction right femoral neck   Advance diet per DDS Up with therapy, touch down weight bearing right lower extremity PT evaluation  Reviewed with patient significance of injury and potential long term sequela including nonunion, avascular necrosis, possible need for conversion to a total hip replacement  Will need routine 2 week follow up to assess fracture healing
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
## 2012-09-02 ENCOUNTER — Encounter (HOSPITAL_COMMUNITY): Payer: Self-pay | Admitting: Orthopedic Surgery

## 2012-09-02 DIAGNOSIS — S02609A Fracture of mandible, unspecified, initial encounter for closed fracture: Secondary | ICD-10-CM

## 2012-09-02 DIAGNOSIS — S7291XA Unspecified fracture of right femur, initial encounter for closed fracture: Secondary | ICD-10-CM

## 2012-09-02 DIAGNOSIS — S2241XA Multiple fractures of ribs, right side, initial encounter for closed fracture: Secondary | ICD-10-CM

## 2012-09-02 DIAGNOSIS — D62 Acute posthemorrhagic anemia: Secondary | ICD-10-CM | POA: Diagnosis not present

## 2012-09-02 MED ORDER — WHITE PETROLATUM GEL
Status: AC
  Administered 2012-09-02: 0.2
  Filled 2012-09-02: qty 5

## 2012-09-02 NOTE — Progress Notes (Signed)
Physical Therapy Treatment Patient Details Name: Drew Medina MRN: [id number] DOB: 1985-08-25 Today's Date: 09/02/2012 Time: 4098-1191 PT Time Calculation (min): 18 min  PT Assessment / Plan / Recommendation Comments on Treatment Session  Pt s/p R hip ORIF and mandible ORIF with jaw wired. Pt c/o increased pain with movement today 8/10. Agreeable to therapy and amb. Continues to progress with mobility and increase indepenence. Pt awaiting incarceration upon D/C.     Follow Up Recommendations        Does the patient have the potential to tolerate intense rehabilitation     Barriers to Discharge        Equipment Recommendations  Rolling walker with 5" wheels    Recommendations for Other Services    Frequency Min 6X/week   Plan Discharge plan remains appropriate;Frequency remains appropriate    Precautions / Restrictions Precautions Precautions: None Restrictions Weight Bearing Restrictions: Yes RLE Weight Bearing: Touchdown weight bearing   Pertinent Vitals/Pain 8/10 with movement; RN notified.     Mobility  Bed Mobility Bed Mobility: Supine to Sit;Sit to Supine;Sitting - Scoot to Edge of Bed Supine to Sit: 3: Mod assist;HOB flat;With rails Sitting - Scoot to Edge of Bed: 4: Min assist Sit to Supine: 3: Mod assist;With rail;HOB flat Details for Bed Mobility Assistance: A to advance R LE onto/off EOB. VC for technique and hand placement. Transfers Transfers: Sit to Stand;Stand to Sit Sit to Stand: 4: Min guard;With upper extremity assist;From bed Stand to Sit: 4: Min guard;With upper extremity assist;To bed Details for Transfer Assistance: vc's for hand placement and safety Ambulation/Gait Ambulation/Gait Assistance: 4: Min assist Ambulation Distance (Feet): 20 Feet Assistive device: Rolling walker Ambulation/Gait Assistance Details: cues for sequencing and upright posture  Gait Pattern: Step-to pattern;Antalgic Gait velocity: decreased General Gait Details:  c/o 8/10 pain with amb Stairs: No Wheelchair Mobility Wheelchair Mobility: No    Exercises     PT Diagnosis:    PT Problem List:   PT Treatment Interventions:     PT Goals Acute Rehab PT Goals PT Goal Formulation: With patient Time For Goal Achievement: 09/09/12 Potential to Achieve Goals: Good PT Goal: Supine/Side to Sit - Progress: Progressing toward goal PT Goal: Sit to Supine/Side - Progress: Progressing toward goal PT Goal: Sit to Stand - Progress: Progressing toward goal PT Goal: Stand to Sit - Progress: Progressing toward goal PT Goal: Ambulate - Progress: Progressing toward goal  Visit Information  Last PT Received On: 09/02/12 Assistance Needed: +1    Subjective Data      Cognition  Cognition Overall Cognitive Status: Appears within functional limits for tasks assessed/performed Arousal/Alertness: Awake/alert Orientation Level: Appears intact for tasks assessed Behavior During Session: [facility] for tasks performed    Balance  Balance Balance Assessed: No  End of Session PT - End of Session Equipment Utilized During Treatment: Gait belt Activity Tolerance: Patient limited by pain Patient left: in bed;with call bell/phone within reach;with bed alarm set Nurse Communication: Mobility status;Patient requests pain meds   GP     Donell Sievert, [place] [phone] 09/02/2012, 3:09 PM

## 2012-09-02 NOTE — Progress Notes (Signed)
3 Days Post-Op  Subjective: He c/o pain in jaw and right hip.   Objective: Vital signs in last 24 hours: Temp:  [97.4 F (36.3 C)-98.1 F (36.7 C)] 97.4 F (36.3 C) (03/25 0547) Pulse Rate:  [91-99] 91 (03/25 0547) Resp:  [18] 18 (03/25 0547) BP: (129-147)/(76-81) 132/81 mmHg (03/25 0547) SpO2:  [99 %-100 %] 99 % (03/25 0547) Last BM Date: 09/01/12  Intake/Output from previous day: 03/24 0701 - 03/25 0700 In: 1320 [P.O.:120; I.V.:1200] Out: 1450 [Urine:1450] Intake/Output this shift:    PE: General- In NAD HEENT-jaw wired shut CV-RRR Lungs-clear Abd-soft, nontender Extr-right hip dressings dry  Lab Results:   Recent Labs  08/31/12 0528 09/01/12 0934  WBC 13.5* 11.2*  HGB 13.6 12.5*  HCT 39.5 37.0*  PLT 207 183   BMET  Recent Labs  08/30/12 1627 08/30/12 1650 08/31/12 0528  NA 142 141 141  K 3.4* 3.4* 4.1  CL 106 109 107  CO2 21  --  25  GLUCOSE 123* 123* 158*  BUN 18 19 15   CREATININE 1.10 1.10 1.02  CALCIUM 9.1  --  8.2*   PT/INR  Recent Labs  08/30/12 1627  LABPROT 13.2  INR 1.01   Comprehensive Metabolic Panel:    Component Value Date/Time   NA 141 08/31/2012 0528   K 4.1 08/31/2012 0528   CL 107 08/31/2012 0528   CO2 25 08/31/2012 0528   BUN 15 08/31/2012 0528   CREATININE 1.02 08/31/2012 0528   GLUCOSE 158* 08/31/2012 0528   CALCIUM 8.2* 08/31/2012 0528   AST 50* 08/30/2012 1627   ALT 50 08/30/2012 1627   ALKPHOS 81 08/30/2012 1627   BILITOT 0.6 08/30/2012 1627   PROT 7.2 08/30/2012 1627   ALBUMIN 4.0 08/30/2012 1627     Studies/Results: No results found.  Anti-infectives: Anti-infectives   Start     Dose/Rate Route Frequency Ordered Stop   08/31/12 0600  ceFAZolin (ANCEF) IVPB 1 g/50 mL premix     1 g 100 mL/hr over 30 Minutes Intravenous 4 times per day 08/31/12 0342     08/30/12 2200  [MAR Hold]  ceFAZolin (ANCEF) IVPB 2 g/50 mL premix     (On MAR Hold since 08/30/12 2102)   2 g 100 mL/hr over 30 Minutes Intravenous  Once  08/30/12 2054 08/31/12 0100      Assessment PHBC  Mandible fx - s/p ORIF Left Parasymphysis fx of the Mandible with Maxillomandibular fixation - Dr. Jeanice Lim  Femur neck fx - s/p closed reduction with assisted ORIF, perc screws fixation - Dr. Charlann Boxer - TDWB RLE, will need f/u with Dr. Charlann Boxer in 2 weeks to assess fracture healing  Rib fractures, pleural effusion L  Back pain - no evidence of fracture on CT  Scalp hematoma  ABL anemia - hgb 12.5 on 3/24  VTE - SCD's  FEN - on liquid/pureed diet Dispo -- PT/OT/SLP:  Not doing well with PT due to pain     LOS: 3 days   Plan: Continue PT/OT.  Discharge when doing better with therapies.   Quy Lotts J 09/02/2012

## 2012-09-02 NOTE — Progress Notes (Signed)
UR completed 

## 2012-09-02 NOTE — Progress Notes (Signed)
Patient ID: Drew Medina, male   DOB: [birth year], 27 y.o.   MRN: [id number] Subjective: 3 Days Post-Op Procedure(s) (LRB): Right CANNULATED HIP PINNING (Right), open reduction     Patient reports pain as mild.  Has been up with therapy, touch down weight bearing  Objective:   VITALS:   Filed Vitals:   09/02/12 1300  BP: 133/86  Pulse: 82  Temp: 99 F (37.2 C)  Resp: 18    Neurovascular intact Incision: dressing C/D/I, right hip dressings removed, incision dry, dermabond overlies wounds  LABS  Recent Labs  08/31/12 0528 09/01/12 0934  HGB 13.6 12.5*  HCT 39.5 37.0*  WBC 13.5* 11.2*  PLT 207 183     Recent Labs  08/31/12 0528  NA 141  K 4.1  BUN 15  CREATININE 1.02  GLUCOSE 158*    No results found for this basename: LABPT, INR,  in the last 72 hours   Assessment/Plan: 3 Days Post-Op Procedure(s) (LRB): Right CANNULATED HIP PINNING (Right) OPEN REDUCTION INTERNAL FIXATION (ORIF) MANDIBULAR FRACTURE (N/A)   Up with therapy, touch down weight bearing right lower extremity until further directed  Discharge pending He will be detained by [place] police  Follow up arrangements will need to be made in order for patient to be seen at appropriate intervals
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
## 2012-09-03 MED ORDER — HYDROCODONE-ACETAMINOPHEN 7.5-325 MG/15ML PO SOLN: 10.0000 mL | ORAL | Status: DC | PRN

## 2012-09-03 NOTE — Progress Notes (Signed)
Stable for discharge This patient has been seen and I agree with the findings and treatment plan.  Charletta Voight O. Eldonna Neuenfeldt, III, MD, FACS [phone] (pager) [phone] (direct pager) Trauma Surgeon  

## 2012-09-03 NOTE — Discharge Summary (Signed)
Patient stable for discharge.  This patient has been seen and I agree with the findings and treatment plan.  Terecia Plaut O. Shakai Dolley, III, MD, FACS [phone] (pager) [phone] (direct pager) Trauma Surgeon 
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
## 2012-09-03 NOTE — Progress Notes (Signed)
Pt is unsure of complete address but knows that it is off [address] in [place]. His mobile number is [phone].  HHPT arranged with [facility] per pt choice and insurance provider limitations.   Rolling walker ordered for pt to take with him at d/c.

## 2012-09-03 NOTE — Progress Notes (Signed)
[facility]  Patient Status: New  [facility] is providing the following services: PT  If patient discharges after hours, please call [phone].   Wynelle Bourgeois 09/03/2012, 11:25 AM
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
## 2012-09-07 ENCOUNTER — Encounter (HOSPITAL_COMMUNITY): Payer: Self-pay | Admitting: *Deleted

## 2012-09-07 ENCOUNTER — Emergency Department (HOSPITAL_COMMUNITY)
Admission: EM | Admit: 2012-09-07 | Discharge: 2012-09-07 | Disposition: A | Discharge status: Home / Self Care | Payer: Self-pay | Attending: Emergency Medicine | Admitting: Emergency Medicine

## 2012-09-07 DIAGNOSIS — Z760 Encounter for issue of repeat prescription: Secondary | ICD-10-CM

## 2012-09-07 DIAGNOSIS — K089 Disorder of teeth and supporting structures, unspecified: Secondary | ICD-10-CM | POA: Insufficient documentation

## 2012-09-07 DIAGNOSIS — G8918 Other acute postprocedural pain: Secondary | ICD-10-CM | POA: Insufficient documentation

## 2012-09-07 DIAGNOSIS — M25559 Pain in unspecified hip: Secondary | ICD-10-CM | POA: Insufficient documentation

## 2012-09-07 DIAGNOSIS — F17200 Nicotine dependence, unspecified, uncomplicated: Secondary | ICD-10-CM | POA: Insufficient documentation

## 2012-09-07 DIAGNOSIS — Z9889 Other specified postprocedural states: Secondary | ICD-10-CM | POA: Insufficient documentation

## 2012-09-07 DIAGNOSIS — Z8781 Personal history of (healed) traumatic fracture: Secondary | ICD-10-CM | POA: Insufficient documentation

## 2012-09-07 DIAGNOSIS — M549 Dorsalgia, unspecified: Secondary | ICD-10-CM | POA: Insufficient documentation

## 2012-09-07 MED ORDER — PERCOCET 5-325 MG PO TABS: 1.0000 | ORAL_TABLET | Freq: Four times a day (QID) | ORAL | Status: DC | PRN

## 2012-09-07 MED ORDER — OXYCODONE-ACETAMINOPHEN 5-325 MG PO TABS
1.0000 | ORAL_TABLET | Freq: Once | ORAL | Status: AC
  Administered 2012-09-07: 1 via ORAL
  Filled 2012-09-07: qty 1

## 2012-09-07 MED ORDER — IBUPROFEN 200 MG PO TABS
400.0000 mg | ORAL_TABLET | Freq: Once | ORAL | Status: AC
  Administered 2012-09-07: 400 mg via ORAL
  Filled 2012-09-07: qty 2

## 2012-09-07 NOTE — ED Notes (Signed)
Pt in to ED c/o right hip pain and mouth pain, pt was in the hospital and had surgery on his right hip and wires placed in his mouth, pt states he was discharged from the hospital and was taken to jail and when he was released from jail they wouldn't give him his pain prescriptions. Pt also requesting to have rubber bands placed back on his mouth wires.
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
## 2012-09-08 NOTE — ED Provider Notes (Signed)
Medical screening examination/treatment/procedure(s) were performed by non-physician practitioner and as supervising physician I was immediately available for consultation/collaboration.   Carleene Cooper III, MD 09/08/12 [phone]

## 2012-11-13 ENCOUNTER — Ambulatory Visit: Payer: Self-pay | Attending: Orthopedic Surgery | Admitting: Physical Therapy

## 2012-11-13 DIAGNOSIS — R262 Difficulty in walking, not elsewhere classified: Secondary | ICD-10-CM | POA: Insufficient documentation

## 2012-11-13 DIAGNOSIS — S72009D Fracture of unspecified part of neck of unspecified femur, subsequent encounter for closed fracture with routine healing: Secondary | ICD-10-CM | POA: Insufficient documentation

## 2012-11-13 DIAGNOSIS — IMO0001 Reserved for inherently not codable concepts without codable children: Secondary | ICD-10-CM | POA: Insufficient documentation

## 2012-11-13 DIAGNOSIS — M25559 Pain in unspecified hip: Secondary | ICD-10-CM | POA: Insufficient documentation

## 2012-11-17 ENCOUNTER — Ambulatory Visit: Payer: No Typology Code available for payment source | Admitting: Physical Therapy

## 2012-11-20 ENCOUNTER — Ambulatory Visit: Payer: Self-pay | Attending: Orthopedic Surgery | Admitting: Physical Therapy

## 2012-11-20 DIAGNOSIS — IMO0001 Reserved for inherently not codable concepts without codable children: Secondary | ICD-10-CM | POA: Insufficient documentation

## 2012-11-20 DIAGNOSIS — S72009D Fracture of unspecified part of neck of unspecified femur, subsequent encounter for closed fracture with routine healing: Secondary | ICD-10-CM | POA: Insufficient documentation

## 2012-11-20 DIAGNOSIS — R262 Difficulty in walking, not elsewhere classified: Secondary | ICD-10-CM | POA: Insufficient documentation

## 2012-11-20 DIAGNOSIS — M25559 Pain in unspecified hip: Secondary | ICD-10-CM | POA: Insufficient documentation

## 2012-11-25 ENCOUNTER — Ambulatory Visit: Payer: Self-pay | Attending: Orthopedic Surgery | Admitting: Physical Therapy

## 2012-11-25 DIAGNOSIS — M25559 Pain in unspecified hip: Secondary | ICD-10-CM | POA: Insufficient documentation

## 2012-11-25 DIAGNOSIS — S72009D Fracture of unspecified part of neck of unspecified femur, subsequent encounter for closed fracture with routine healing: Secondary | ICD-10-CM | POA: Insufficient documentation

## 2012-11-25 DIAGNOSIS — IMO0001 Reserved for inherently not codable concepts without codable children: Secondary | ICD-10-CM | POA: Insufficient documentation

## 2012-11-25 DIAGNOSIS — R262 Difficulty in walking, not elsewhere classified: Secondary | ICD-10-CM | POA: Insufficient documentation

## 2012-11-26 ENCOUNTER — Ambulatory Visit: Payer: Self-pay | Attending: Orthopedic Surgery | Admitting: Physical Therapy

## 2012-11-26 DIAGNOSIS — S72009D Fracture of unspecified part of neck of unspecified femur, subsequent encounter for closed fracture with routine healing: Secondary | ICD-10-CM | POA: Insufficient documentation

## 2012-11-26 DIAGNOSIS — M25559 Pain in unspecified hip: Secondary | ICD-10-CM | POA: Insufficient documentation

## 2012-11-26 DIAGNOSIS — R262 Difficulty in walking, not elsewhere classified: Secondary | ICD-10-CM | POA: Insufficient documentation

## 2012-11-26 DIAGNOSIS — IMO0001 Reserved for inherently not codable concepts without codable children: Secondary | ICD-10-CM | POA: Insufficient documentation

## 2012-12-02 ENCOUNTER — Ambulatory Visit: Payer: Self-pay | Admitting: Physical Therapy

## 2012-12-04 ENCOUNTER — Ambulatory Visit: Payer: Self-pay | Attending: Orthopedic Surgery | Admitting: Physical Therapy

## 2012-12-04 DIAGNOSIS — M25559 Pain in unspecified hip: Secondary | ICD-10-CM | POA: Insufficient documentation

## 2012-12-04 DIAGNOSIS — IMO0001 Reserved for inherently not codable concepts without codable children: Secondary | ICD-10-CM | POA: Insufficient documentation

## 2012-12-04 DIAGNOSIS — S72009D Fracture of unspecified part of neck of unspecified femur, subsequent encounter for closed fracture with routine healing: Secondary | ICD-10-CM | POA: Insufficient documentation

## 2012-12-04 DIAGNOSIS — R262 Difficulty in walking, not elsewhere classified: Secondary | ICD-10-CM | POA: Insufficient documentation

## 2012-12-08 ENCOUNTER — Encounter: Payer: Self-pay | Admitting: Physical Therapy

## 2012-12-09 ENCOUNTER — Ambulatory Visit: Payer: Self-pay | Admitting: Physical Therapy

## 2012-12-11 ENCOUNTER — Ambulatory Visit: Payer: No Typology Code available for payment source | Admitting: Physical Therapy

## 2012-12-17 ENCOUNTER — Ambulatory Visit: Payer: No Typology Code available for payment source | Admitting: Physical Therapy

## 2012-12-23 ENCOUNTER — Ambulatory Visit: Payer: Self-pay | Attending: Orthopedic Surgery | Admitting: Physical Therapy

## 2012-12-23 DIAGNOSIS — S72009D Fracture of unspecified part of neck of unspecified femur, subsequent encounter for closed fracture with routine healing: Secondary | ICD-10-CM | POA: Insufficient documentation

## 2012-12-23 DIAGNOSIS — M25559 Pain in unspecified hip: Secondary | ICD-10-CM | POA: Insufficient documentation

## 2012-12-23 DIAGNOSIS — R262 Difficulty in walking, not elsewhere classified: Secondary | ICD-10-CM | POA: Insufficient documentation

## 2012-12-23 DIAGNOSIS — IMO0001 Reserved for inherently not codable concepts without codable children: Secondary | ICD-10-CM | POA: Insufficient documentation

## 2012-12-30 ENCOUNTER — Other Ambulatory Visit (HOSPITAL_COMMUNITY): Payer: Self-pay | Admitting: Orthopedic Surgery

## 2012-12-30 ENCOUNTER — Ambulatory Visit: Payer: Self-pay | Attending: Orthopedic Surgery | Admitting: Physical Therapy

## 2012-12-30 ENCOUNTER — Ambulatory Visit (HOSPITAL_COMMUNITY)
Admission: RE | Admit: 2012-12-30 | Discharge: 2012-12-30 | Disposition: A | Discharge status: Home / Self Care | Payer: Self-pay | Source: Ambulatory Visit | Attending: Orthopedic Surgery | Admitting: Orthopedic Surgery

## 2012-12-30 DIAGNOSIS — M25559 Pain in unspecified hip: Secondary | ICD-10-CM | POA: Insufficient documentation

## 2012-12-30 DIAGNOSIS — S72009D Fracture of unspecified part of neck of unspecified femur, subsequent encounter for closed fracture with routine healing: Secondary | ICD-10-CM | POA: Insufficient documentation

## 2012-12-30 DIAGNOSIS — M899 Disorder of bone, unspecified: Secondary | ICD-10-CM | POA: Insufficient documentation

## 2012-12-30 DIAGNOSIS — T148XXA Other injury of unspecified body region, initial encounter: Secondary | ICD-10-CM

## 2012-12-30 DIAGNOSIS — R262 Difficulty in walking, not elsewhere classified: Secondary | ICD-10-CM | POA: Insufficient documentation

## 2012-12-30 DIAGNOSIS — IMO0001 Reserved for inherently not codable concepts without codable children: Secondary | ICD-10-CM | POA: Insufficient documentation

## 2012-12-30 DIAGNOSIS — IMO0002 Reserved for concepts with insufficient information to code with codable children: Secondary | ICD-10-CM | POA: Insufficient documentation

## 2013-01-01 ENCOUNTER — Ambulatory Visit: Payer: Self-pay | Attending: Orthopedic Surgery | Admitting: Physical Therapy

## 2013-01-01 DIAGNOSIS — R262 Difficulty in walking, not elsewhere classified: Secondary | ICD-10-CM | POA: Insufficient documentation

## 2013-01-01 DIAGNOSIS — IMO0001 Reserved for inherently not codable concepts without codable children: Secondary | ICD-10-CM | POA: Insufficient documentation

## 2013-01-01 DIAGNOSIS — S72009D Fracture of unspecified part of neck of unspecified femur, subsequent encounter for closed fracture with routine healing: Secondary | ICD-10-CM | POA: Insufficient documentation

## 2013-01-01 DIAGNOSIS — M25559 Pain in unspecified hip: Secondary | ICD-10-CM | POA: Insufficient documentation

## 2013-01-06 ENCOUNTER — Ambulatory Visit: Payer: Self-pay | Attending: Orthopedic Surgery | Admitting: Physical Therapy

## 2013-01-06 DIAGNOSIS — S72009D Fracture of unspecified part of neck of unspecified femur, subsequent encounter for closed fracture with routine healing: Secondary | ICD-10-CM | POA: Insufficient documentation

## 2013-01-06 DIAGNOSIS — IMO0001 Reserved for inherently not codable concepts without codable children: Secondary | ICD-10-CM | POA: Insufficient documentation

## 2013-01-06 DIAGNOSIS — M25559 Pain in unspecified hip: Secondary | ICD-10-CM | POA: Insufficient documentation

## 2013-01-06 DIAGNOSIS — R262 Difficulty in walking, not elsewhere classified: Secondary | ICD-10-CM | POA: Insufficient documentation

## 2013-01-08 ENCOUNTER — Ambulatory Visit: Payer: No Typology Code available for payment source | Attending: Orthopedic Surgery | Admitting: Physical Therapy

## 2013-01-13 ENCOUNTER — Ambulatory Visit: Payer: Self-pay | Attending: Orthopedic Surgery | Admitting: Physical Therapy

## 2013-01-13 DIAGNOSIS — M25559 Pain in unspecified hip: Secondary | ICD-10-CM | POA: Insufficient documentation

## 2013-01-13 DIAGNOSIS — R262 Difficulty in walking, not elsewhere classified: Secondary | ICD-10-CM | POA: Insufficient documentation

## 2013-01-13 DIAGNOSIS — IMO0001 Reserved for inherently not codable concepts without codable children: Secondary | ICD-10-CM | POA: Insufficient documentation

## 2013-01-13 DIAGNOSIS — S72009D Fracture of unspecified part of neck of unspecified femur, subsequent encounter for closed fracture with routine healing: Secondary | ICD-10-CM | POA: Insufficient documentation

## 2013-01-15 ENCOUNTER — Ambulatory Visit: Payer: Self-pay | Attending: Orthopedic Surgery | Admitting: Physical Therapy

## 2013-01-15 DIAGNOSIS — S72009D Fracture of unspecified part of neck of unspecified femur, subsequent encounter for closed fracture with routine healing: Secondary | ICD-10-CM | POA: Insufficient documentation

## 2013-01-15 DIAGNOSIS — R262 Difficulty in walking, not elsewhere classified: Secondary | ICD-10-CM | POA: Insufficient documentation

## 2013-01-15 DIAGNOSIS — M25559 Pain in unspecified hip: Secondary | ICD-10-CM | POA: Insufficient documentation

## 2013-01-15 DIAGNOSIS — IMO0001 Reserved for inherently not codable concepts without codable children: Secondary | ICD-10-CM | POA: Insufficient documentation

## 2013-01-20 ENCOUNTER — Ambulatory Visit: Payer: No Typology Code available for payment source | Admitting: Physical Therapy

## 2013-01-22 ENCOUNTER — Ambulatory Visit: Payer: No Typology Code available for payment source | Admitting: Rehabilitation

## 2013-02-02 ENCOUNTER — Ambulatory Visit: Payer: No Typology Code available for payment source | Attending: Orthopedic Surgery | Admitting: Physical Therapy

## 2017-02-08 ENCOUNTER — Emergency Department (HOSPITAL_COMMUNITY): Payer: Self-pay

## 2017-02-08 ENCOUNTER — Encounter (HOSPITAL_COMMUNITY): Payer: Self-pay

## 2017-02-08 ENCOUNTER — Emergency Department (HOSPITAL_COMMUNITY)
Admission: EM | Admit: 2017-02-08 | Discharge: 2017-02-09 | Disposition: A | Discharge status: Home / Self Care | Payer: Self-pay | Attending: Emergency Medicine | Admitting: Emergency Medicine

## 2017-02-08 DIAGNOSIS — Z5320 Procedure and treatment not carried out because of patient's decision for unspecified reasons: Secondary | ICD-10-CM

## 2017-02-08 DIAGNOSIS — Z9119 Patient's noncompliance with other medical treatment and regimen: Secondary | ICD-10-CM | POA: Insufficient documentation

## 2017-02-08 DIAGNOSIS — Z711 Person with feared health complaint in whom no diagnosis is made: Secondary | ICD-10-CM | POA: Insufficient documentation

## 2017-02-08 DIAGNOSIS — F17210 Nicotine dependence, cigarettes, uncomplicated: Secondary | ICD-10-CM | POA: Insufficient documentation

## 2017-02-08 DIAGNOSIS — R1030 Lower abdominal pain, unspecified: Secondary | ICD-10-CM | POA: Insufficient documentation

## 2017-02-08 LAB — URINALYSIS, ROUTINE W REFLEX MICROSCOPIC
Bilirubin Urine: NEGATIVE
Glucose, UA: NEGATIVE mg/dL
Hgb urine dipstick: NEGATIVE
Ketones, ur: NEGATIVE mg/dL
LEUKOCYTES UA: NEGATIVE
NITRITE: NEGATIVE
PROTEIN: NEGATIVE mg/dL
Specific Gravity, Urine: 1.013 (ref 1.005–1.030)
pH: 6 (ref 5.0–8.0)

## 2017-02-08 LAB — CBC
HCT: 46.4 % (ref 39.0–52.0)
Hemoglobin: 15.3 g/dL (ref 13.0–17.0)
MCH: 30.1 pg (ref 26.0–34.0)
MCHC: 33 g/dL (ref 30.0–36.0)
MCV: 91.3 fL (ref 78.0–100.0)
PLATELETS: 229 10*3/uL (ref 150–400)
RBC: 5.08 MIL/uL (ref 4.22–5.81)
RDW: 14.1 % (ref 11.5–15.5)
WBC: 9.3 10*3/uL (ref 4.0–10.5)

## 2017-02-08 LAB — COMPREHENSIVE METABOLIC PANEL
ALK PHOS: 84 U/L (ref 38–126)
ALT: 46 U/L (ref 17–63)
ANION GAP: 8 (ref 5–15)
AST: 36 U/L (ref 15–41)
Albumin: 4.1 g/dL (ref 3.5–5.0)
BUN: 11 mg/dL (ref 6–20)
CO2: 24 mmol/L (ref 22–32)
CREATININE: 0.98 mg/dL (ref 0.61–1.24)
Calcium: 9 mg/dL (ref 8.9–10.3)
Chloride: 108 mmol/L (ref 101–111)
Glucose, Bld: 107 mg/dL — ABNORMAL HIGH (ref 65–99)
Potassium: 3.9 mmol/L (ref 3.5–5.1)
SODIUM: 140 mmol/L (ref 135–145)
TOTAL PROTEIN: 7 g/dL (ref 6.5–8.1)
Total Bilirubin: 1.2 mg/dL (ref 0.3–1.2)

## 2017-02-08 LAB — LIPASE, BLOOD: Lipase: 46 U/L (ref 11–51)

## 2017-02-08 MED ORDER — SODIUM CHLORIDE 0.9 % IV BOLUS (SEPSIS)
1000.0000 mL | Freq: Once | INTRAVENOUS | Status: AC
  Administered 2017-02-08: 1000 mL via INTRAVENOUS

## 2017-02-08 MED ORDER — STERILE WATER FOR INJECTION IJ SOLN
INTRAMUSCULAR | Status: AC
  Administered 2017-02-08: 0.9 mL
  Filled 2017-02-08: qty 10

## 2017-02-08 MED ORDER — IOPAMIDOL (ISOVUE-300) INJECTION 61%
INTRAVENOUS | Status: AC
  Filled 2017-02-08: qty 100

## 2017-02-08 MED ORDER — CEFTRIAXONE SODIUM 250 MG IJ SOLR
250.0000 mg | Freq: Once | INTRAMUSCULAR | Status: AC
  Administered 2017-02-08: 250 mg via INTRAMUSCULAR
  Filled 2017-02-08: qty 250

## 2017-02-08 MED ORDER — AZITHROMYCIN 250 MG PO TABS
1000.0000 mg | ORAL_TABLET | Freq: Once | ORAL | Status: AC
  Administered 2017-02-08: 1000 mg via ORAL
  Filled 2017-02-08: qty 4

## 2017-02-08 NOTE — ED Notes (Signed)
Pt noted to no longer be in his room. Gown, BP cuff and O2 sensor on bed and IV disconnected from pt. IV catheter is not accounted for. This RN attempted to call pt, with no answer.

## 2017-02-08 NOTE — ED Triage Notes (Signed)
Pt presents to the ed with complaints of lower abdominal pain x 3 days.  Pt also endorses burning with urination. Denies nausea, reports one episode of vomiting.
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
## 2017-02-08 NOTE — ED Notes (Signed)
Patient c/o abd. Pain onset 2 days ago, c/o nausea no vomiting.  C/o difficulty with urinating yest , denies bowel problems.

## 2017-02-08 NOTE — ED Notes (Signed)
Attempted to call pt again unsuccessfully

## 2017-02-09 LAB — RPR: RPR: NONREACTIVE

## 2017-02-09 LAB — HIV ANTIBODY (ROUTINE TESTING W REFLEX): HIV Screen 4th Generation wRfx: NONREACTIVE

## 2018-02-15 ENCOUNTER — Encounter (HOSPITAL_COMMUNITY): Payer: Self-pay | Admitting: *Deleted

## 2018-02-15 ENCOUNTER — Emergency Department (HOSPITAL_COMMUNITY)
Admission: EM | Admit: 2018-02-15 | Discharge: 2018-02-15 | Disposition: A | Discharge status: Home / Self Care | Payer: Self-pay | Attending: Emergency Medicine | Admitting: Emergency Medicine

## 2018-02-15 ENCOUNTER — Ambulatory Visit (HOSPITAL_COMMUNITY): Admission: EM | Admit: 2018-02-15 | Discharge: 2018-02-15 | Discharge status: Against Medical Advice | Payer: Self-pay

## 2018-02-15 ENCOUNTER — Other Ambulatory Visit: Payer: Self-pay

## 2018-02-15 DIAGNOSIS — Z113 Encounter for screening for infections with a predominantly sexual mode of transmission: Secondary | ICD-10-CM | POA: Insufficient documentation

## 2018-02-15 DIAGNOSIS — Z87891 Personal history of nicotine dependence: Secondary | ICD-10-CM | POA: Insufficient documentation

## 2018-02-15 DIAGNOSIS — Z7689 Persons encountering health services in other specified circumstances: Secondary | ICD-10-CM

## 2018-02-15 LAB — URINALYSIS, ROUTINE W REFLEX MICROSCOPIC
Bilirubin Urine: NEGATIVE
Glucose, UA: NEGATIVE mg/dL
Hgb urine dipstick: NEGATIVE
KETONES UR: NEGATIVE mg/dL
LEUKOCYTES UA: NEGATIVE
NITRITE: NEGATIVE
Protein, ur: NEGATIVE mg/dL
SPECIFIC GRAVITY, URINE: 1.014 (ref 1.005–1.030)
pH: 8 (ref 5.0–8.0)

## 2018-02-15 LAB — RAPID HIV SCREEN (HIV 1/2 AB+AG)
HIV 1/2 Antibodies: NONREACTIVE
HIV-1 P24 Antigen - HIV24: NONREACTIVE

## 2018-02-15 MED ORDER — AZITHROMYCIN 250 MG PO TABS
1000.0000 mg | ORAL_TABLET | Freq: Once | ORAL | Status: AC
  Administered 2018-02-15: 1000 mg via ORAL
  Filled 2018-02-15: qty 4

## 2018-02-15 MED ORDER — LIDOCAINE HCL 1 % IJ SOLN
INTRAMUSCULAR | Status: AC
  Administered 2018-02-15: 0.9 mL
  Filled 2018-02-15: qty 20

## 2018-02-15 MED ORDER — CEFTRIAXONE SODIUM 250 MG IJ SOLR
250.0000 mg | Freq: Once | INTRAMUSCULAR | Status: AC
  Administered 2018-02-15: 250 mg via INTRAMUSCULAR
  Filled 2018-02-15: qty 250

## 2018-02-15 NOTE — Discharge Instructions (Addendum)
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
## 2018-02-15 NOTE — ED Notes (Signed)
Pt left prior to receiving discharge instructions.

## 2018-02-15 NOTE — ED Triage Notes (Signed)
Pt reports having last unprotected intercourse last Saturday.  Pt states his partner felt "off" and they decided they would get tested for STDs.  Pt denies any discharge or pain upon urinary symptoms.  Pt a/o x 4 and and ambulatory.

## 2018-02-16 LAB — RPR: RPR: NONREACTIVE

## 2018-02-17 LAB — GC/CHLAMYDIA PROBE AMP (~~LOC~~) NOT AT ARMC
Chlamydia: NEGATIVE
Neisseria Gonorrhea: NEGATIVE

## 2018-05-10 ENCOUNTER — Emergency Department (HOSPITAL_COMMUNITY)
Admission: EM | Admit: 2018-05-10 | Discharge: 2018-05-10 | Disposition: A | Discharge status: Home / Self Care | Payer: No Typology Code available for payment source | Attending: Emergency Medicine | Admitting: Emergency Medicine

## 2018-05-10 ENCOUNTER — Emergency Department (HOSPITAL_COMMUNITY): Payer: No Typology Code available for payment source

## 2018-05-10 ENCOUNTER — Encounter (HOSPITAL_COMMUNITY): Payer: Self-pay | Admitting: *Deleted

## 2018-05-10 ENCOUNTER — Other Ambulatory Visit: Payer: Self-pay

## 2018-05-10 DIAGNOSIS — M545 Low back pain, unspecified: Secondary | ICD-10-CM

## 2018-05-10 DIAGNOSIS — Z79899 Other long term (current) drug therapy: Secondary | ICD-10-CM | POA: Insufficient documentation

## 2018-05-10 DIAGNOSIS — M25551 Pain in right hip: Secondary | ICD-10-CM | POA: Insufficient documentation

## 2018-05-10 DIAGNOSIS — Z87891 Personal history of nicotine dependence: Secondary | ICD-10-CM | POA: Insufficient documentation

## 2018-05-10 MED ORDER — METHOCARBAMOL 500 MG PO TABS: 1000.0000 mg | ORAL_TABLET | Freq: Four times a day (QID) | ORAL | 0 refills | Status: AC

## 2018-05-10 MED ORDER — NAPROXEN 250 MG PO TABS
500.0000 mg | ORAL_TABLET | Freq: Once | ORAL | Status: AC
  Administered 2018-05-10: 500 mg via ORAL
  Filled 2018-05-10: qty 2

## 2018-05-10 MED ORDER — NAPROXEN 500 MG PO TABS: 500.0000 mg | ORAL_TABLET | Freq: Two times a day (BID) | ORAL | 0 refills | Status: AC

## 2018-05-10 NOTE — ED Notes (Signed)
Pt to xray
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
## 2019-06-29 IMAGING — CT CT HIP*R* W/O CM
2 of 3 series · 17 of 46 positions shown, 19 images · non-contrast
Comparison: Plain films today as well as previous CT 12/30/2012

CLINICAL DATA: Hip pain as suspect acute fracture. Negative x-rays.

EXAM:
CT OF THE RIGHT HIP WITHOUT CONTRAST
TECHNIQUE: Multidetector CT imaging of the right hip was performed according to
the standard protocol. Multiplanar CT image reconstructions were
also generated.

[Series 3: pelvis 2.0 (person_name) (person_name) · axial · 0.47mm/px · z∈[+828,+1012]mm · 14 of 106 slices shown, 16 images]
[im 7/106  soft-tissue]
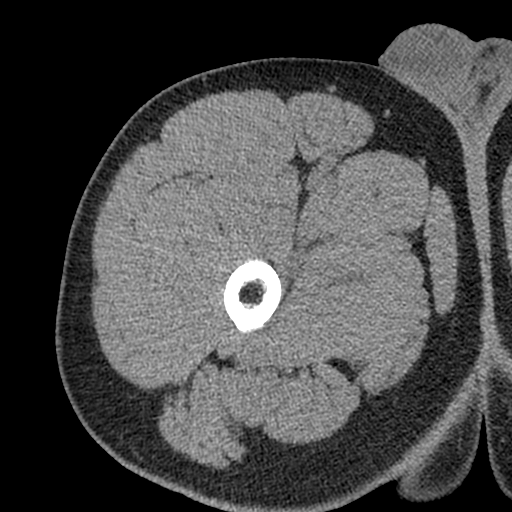
[im 7/106  bone]
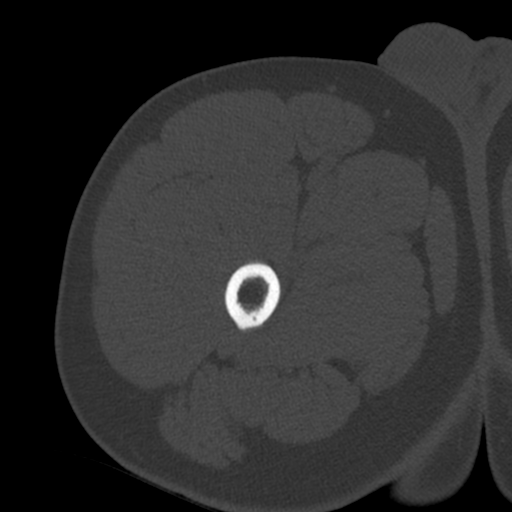
[im 14/106  soft-tissue]
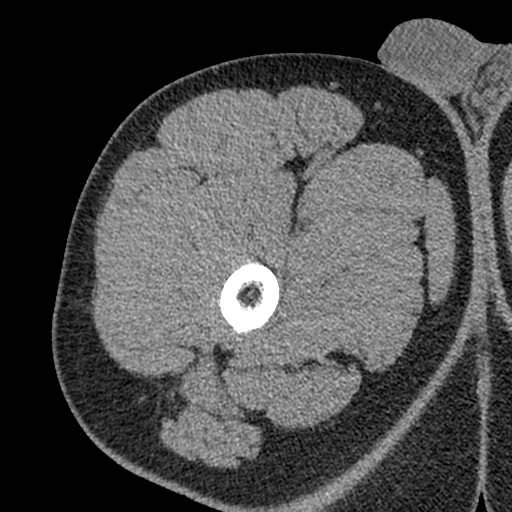
[im 21/106  soft-tissue]
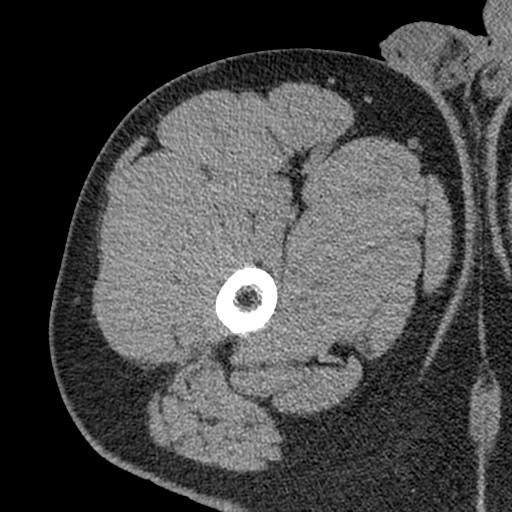
[im 28/106  soft-tissue]
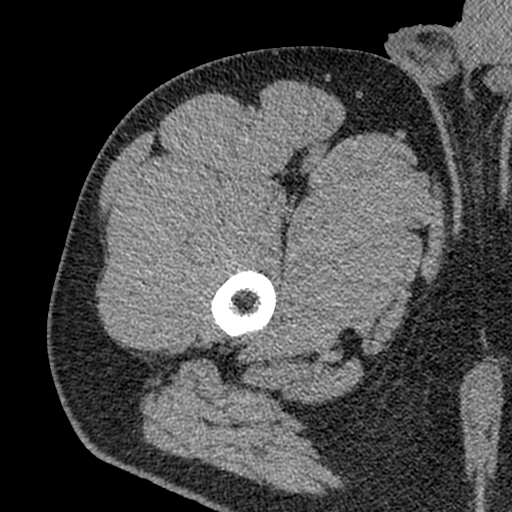
[im 34/106  soft-tissue]
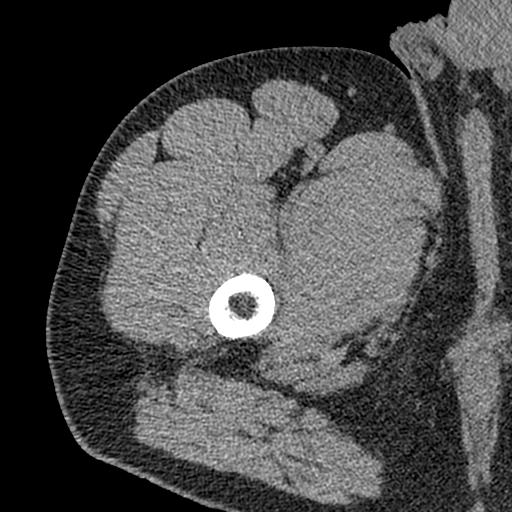
[im 41/106  soft-tissue]
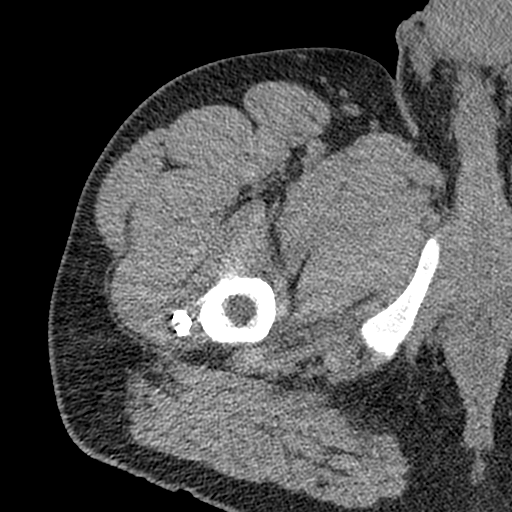
[im 48/106  soft-tissue]
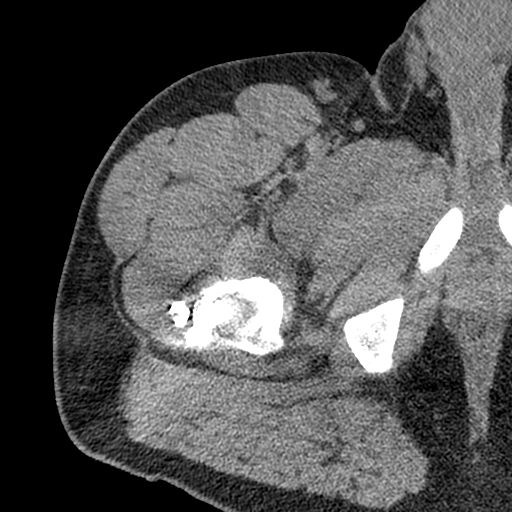
[im 58/106  soft-tissue]
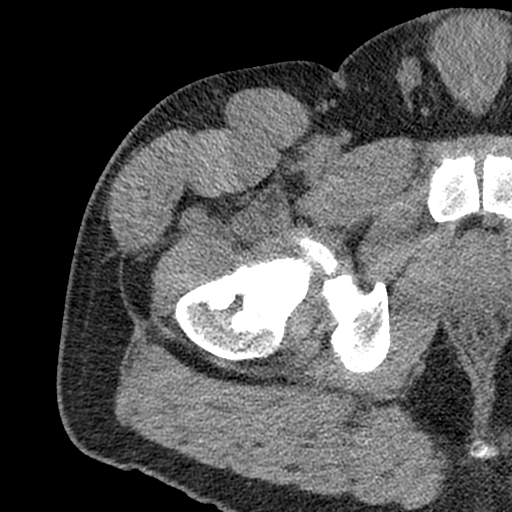
[im 65/106  soft-tissue]
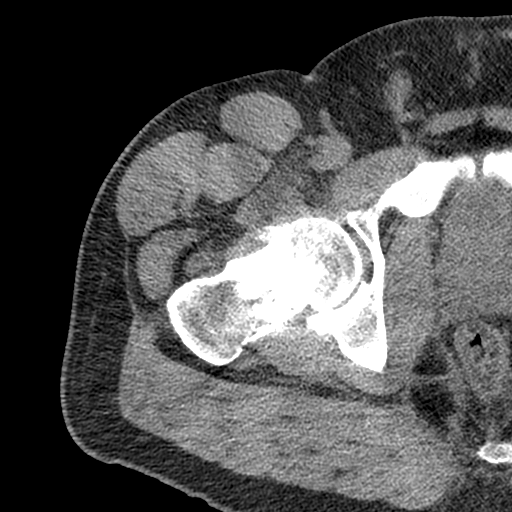
[im 65/106  bone]
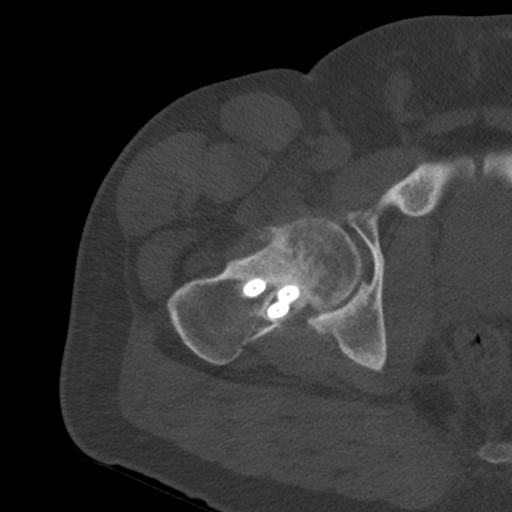
[im 72/106  soft-tissue]
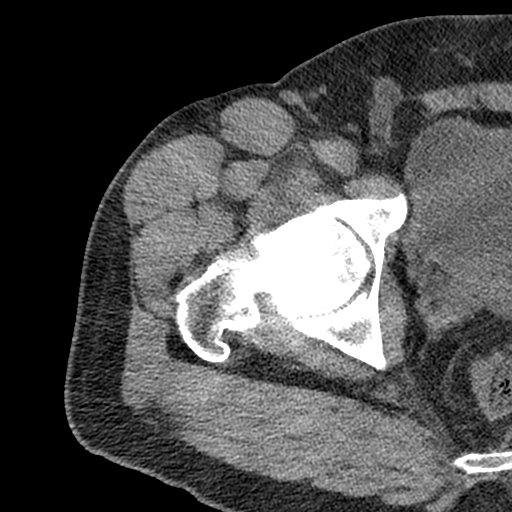
[im 78/106  soft-tissue]
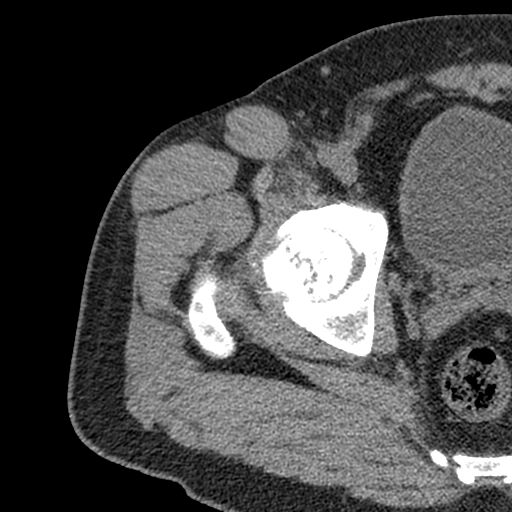
[im 85/106  soft-tissue]
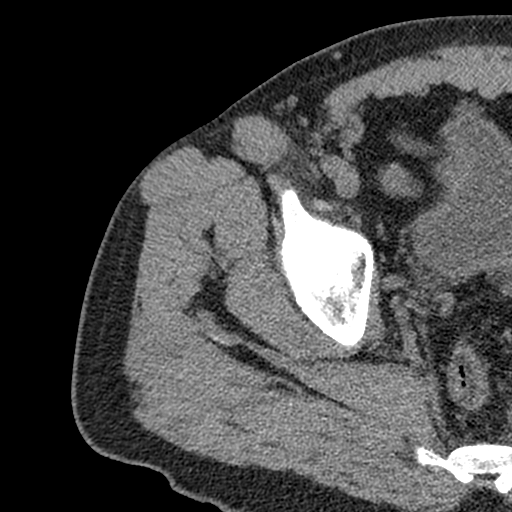
[im 92/106  soft-tissue]
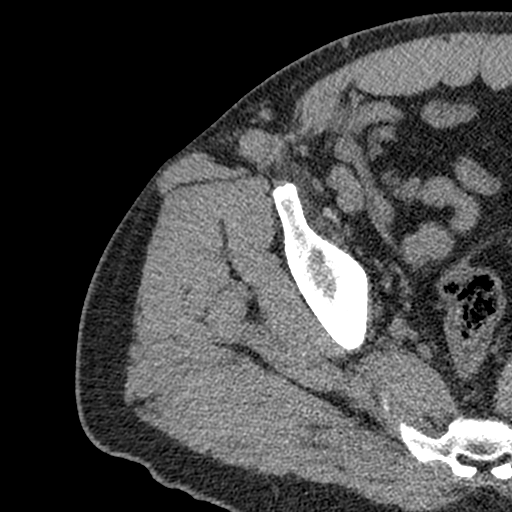
[im 99/106  soft-tissue]
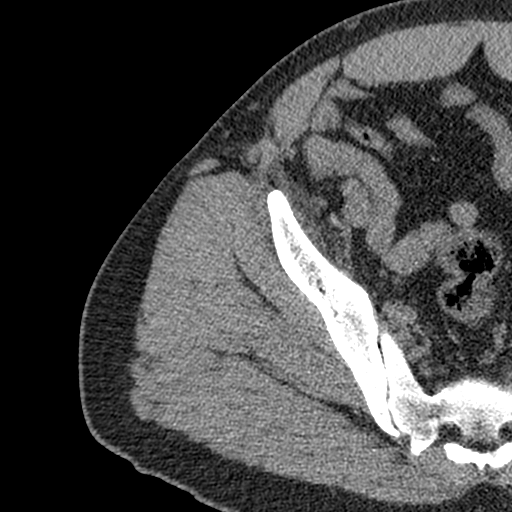

[Series 8: coronal st · coronal · 0.41mm/px · 3 of 177 slices shown]
[im 59/177  soft-tissue]
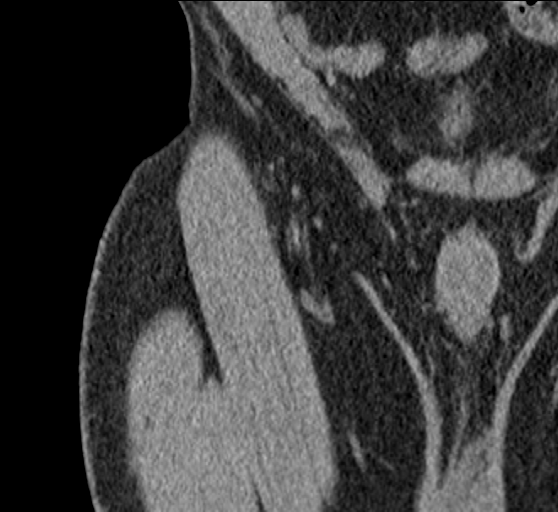
[im 79/177  soft-tissue]
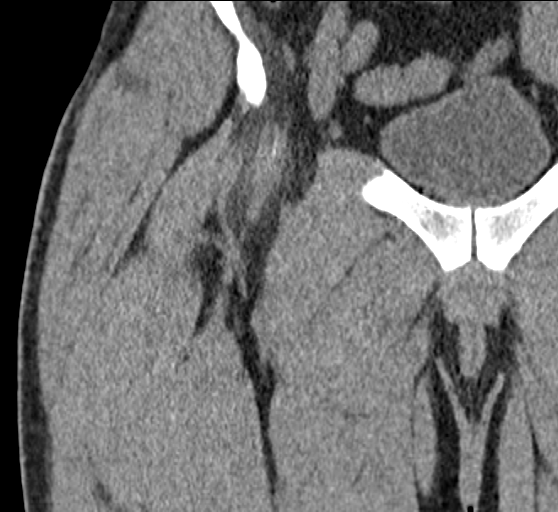
[im 98/177  soft-tissue]
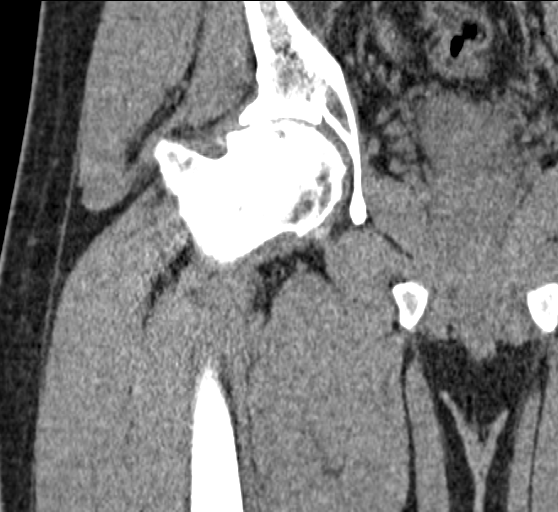

[17 of 46 positions shown; findings below may reference images not displayed]

FINDINGS: Bones/Joint/Cartilage

Three orthopedic screws bridge the right femoral neck from inferior
to superior and are intact. There is are mild secondary degenerative
changes of the right hip. There is no definite acute fracture or
dislocation.

Ligaments

Suboptimally assessed by CT.

Muscles and Tendons

Unremarkable.

Soft tissues

Unremarkable.
IMPRESSION: No acute fracture or dislocation.

Hardware bridging the right femoral neck intact and unchanged.
Secondary degenerative changes of the right hip.

## 2022-12-15 ENCOUNTER — Emergency Department (HOSPITAL_COMMUNITY)
Admission: EM | Admit: 2022-12-15 | Discharge: 2022-12-15 | Disposition: A | Discharge status: Home / Self Care | Payer: Managed Care, Other (non HMO) | Attending: Emergency Medicine | Admitting: Emergency Medicine

## 2022-12-15 ENCOUNTER — Other Ambulatory Visit: Payer: Self-pay

## 2022-12-15 DIAGNOSIS — T7840XA Allergy, unspecified, initial encounter: Secondary | ICD-10-CM

## 2022-12-15 LAB — BASIC METABOLIC PANEL
Anion gap: 6 (ref 5–15)
BUN: 13 mg/dL (ref 6–20)
CO2: 21 mmol/L — ABNORMAL LOW (ref 22–32)
Calcium: 8 mg/dL — ABNORMAL LOW (ref 8.9–10.3)
Chloride: 111 mmol/L (ref 98–111)
Creatinine, Ser: 0.84 mg/dL (ref 0.61–1.24)
GFR, Estimated: >60 mL/min (ref >60)
Glucose, Bld: 102 mg/dL — ABNORMAL HIGH (ref 70–99)
Potassium: 3.4 mmol/L — ABNORMAL LOW (ref 3.5–5.1)
Sodium: 138 mmol/L (ref 135–145)

## 2022-12-15 LAB — CBC
HCT: 44.8 % (ref 39.0–52.0)
Hemoglobin: 14.5 g/dL (ref 13.0–17.0)
MCH: 29.7 pg (ref 26.0–34.0)
MCHC: 32.4 g/dL (ref 30.0–36.0)
MCV: 91.6 fL (ref 80.0–100.0)
Platelets: 155 10*3/uL (ref 150–400)
RBC: 4.89 MIL/uL (ref 4.22–5.81)
RDW: 14.2 % (ref 11.5–15.5)
WBC: 12.1 10*3/uL — ABNORMAL HIGH (ref 4.0–10.5)
nRBC: 0 % (ref 0.0–0.2)

## 2022-12-15 MED ORDER — LORATADINE 10 MG PO TABS: 10.0000 mg | ORAL_TABLET | Freq: Every day | ORAL | 0 refills | Status: AC | PRN

## 2022-12-15 MED ORDER — PREDNISONE 50 MG PO TABS: 50.0000 mg | ORAL_TABLET | Freq: Every day | ORAL | 0 refills | Status: AC

## 2022-12-15 MED ORDER — SODIUM CHLORIDE 0.9 % IV SOLN: INTRAVENOUS | Status: DC

## 2022-12-15 MED ORDER — FAMOTIDINE IN NACL 20-0.9 MG/50ML-% IV SOLN
20.0000 mg | Freq: Once | INTRAVENOUS | Status: AC
  Administered 2022-12-15: 20 mg via INTRAVENOUS
  Filled 2022-12-15: qty 50

## 2022-12-15 MED ORDER — EPINEPHRINE 0.3 MG/0.3ML IJ SOAJ: 0.3000 mg | INTRAMUSCULAR | 0 refills | Status: AC | PRN

## 2022-12-15 MED ORDER — SODIUM CHLORIDE 0.9 % IV BOLUS
1000.0000 mL | Freq: Once | INTRAVENOUS | Status: AC
  Administered 2022-12-15: 1000 mL via INTRAVENOUS
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
